# Patient Record
Sex: Male | Born: 1965 | Race: White | Hispanic: Yes | Marital: Married | Smoking: Never smoker
Health system: Western US, Academic
[De-identification: ages and names within clinical notes are randomized; demographics above are authoritative.]

## PROBLEM LIST (undated history)

## (undated) MED ORDER — SODIUM CHLORIDE 0.9 % IV SOLN
INTRAVENOUS | Status: AC
Start: 2014-03-22 — End: ?

---

## 2014-03-02 ENCOUNTER — Encounter (HOSPITAL_COMMUNITY): Payer: Self-pay | Admitting: Nurse Practitioner

## 2014-03-02 DIAGNOSIS — I2724 Chronic thromboembolic pulmonary hypertension: Secondary | ICD-10-CM

## 2014-03-13 ENCOUNTER — Other Ambulatory Visit: Payer: Self-pay | Admitting: Nurse Practitioner

## 2014-03-20 ENCOUNTER — Other Ambulatory Visit: Payer: Managed Care, Other (non HMO) | Attending: Nurse Practitioner

## 2014-03-20 ENCOUNTER — Ambulatory Visit
Admission: RE | Admit: 2014-03-20 | Discharge: 2014-03-20 | Disposition: A | Discharge status: Home / Self Care | Payer: Managed Care, Other (non HMO) | Source: Ambulatory Visit | Attending: Neuroradiology | Admitting: Neuroradiology

## 2014-03-20 ENCOUNTER — Ambulatory Visit
Admission: RE | Admit: 2014-03-20 | Discharge: 2014-03-20 | Disposition: A | Discharge status: Home / Self Care | Payer: Managed Care, Other (non HMO) | Source: Ambulatory Visit | Attending: Nurse Practitioner | Admitting: Nurse Practitioner

## 2014-03-20 DIAGNOSIS — I270 Primary pulmonary hypertension: Secondary | ICD-10-CM | POA: Insufficient documentation

## 2014-03-20 DIAGNOSIS — I2699 Other pulmonary embolism without acute cor pulmonale: Secondary | ICD-10-CM | POA: Insufficient documentation

## 2014-03-20 DIAGNOSIS — Z01818 Encounter for other preprocedural examination: Secondary | ICD-10-CM | POA: Insufficient documentation

## 2014-03-20 DIAGNOSIS — I272 Other secondary pulmonary hypertension: Secondary | ICD-10-CM

## 2014-03-20 DIAGNOSIS — I2724 Chronic thromboembolic pulmonary hypertension: Secondary | ICD-10-CM

## 2014-03-20 LAB — COMPREHENSIVE METABOLIC PANEL, BLOOD
ALT (SGPT): 27 U/L (ref 0–41)
AST (SGOT): 27 U/L (ref 0–40)
Albumin: 4.2 g/dL (ref 3.5–5.2)
Alkaline Phos: 43 U/L (ref 40–129)
Anion Gap: 14 mmol/L (ref 7–15)
BUN: 54 mg/dL — ABNORMAL HIGH (ref 6–20)
Bicarbonate: 23 mmol/L (ref 22–29)
Bilirubin, Tot: 0.18 mg/dL (ref <1.20)
Calcium: 9.5 mg/dL (ref 8.5–10.6)
Chloride: 104 mmol/L (ref 98–107)
Creatinine: 2.47 mg/dL — ABNORMAL HIGH (ref 0.67–1.17)
GFR: 28 mL/min
Glucose: 180 mg/dL — ABNORMAL HIGH (ref 70–115)
Potassium: 4.2 mmol/L (ref 3.5–5.1)
Sodium: 141 mmol/L (ref 136–145)
Total Protein: 7.4 g/dL (ref 6.0–8.0)

## 2014-03-20 LAB — URINALYSIS
Bilirubin: NEGATIVE
Blood: NEGATIVE
Glucose: NEGATIVE
Ketones: NEGATIVE
Leuk Esterase: NEGATIVE
Nitrite: NEGATIVE
Protein: NEGATIVE
Specific Gravity: 1.012 (ref 1.002–1.030)
Urobilinogen: NEGATIVE
pH: 6 (ref 5.0–8.0)

## 2014-03-20 LAB — CBC WITH DIFF, BLOOD
ANC-Automated: 5.6 10*3/uL (ref 1.6–7.0)
Abs Basophils: 0.1 10*3/uL (ref <0.1)
Abs Eosinophils: 0.1 10*3/uL (ref 0.1–0.7)
Abs Lymphs: 2.3 10*3/uL (ref 0.8–3.1)
Abs Monos: 0.7 10*3/uL (ref 0.2–0.8)
Basophils: 1 % (ref 0–2)
Eosinophils: 1 % (ref 1–4)
Hct: 30.7 % — ABNORMAL LOW (ref 40.0–50.0)
Hgb: 9.8 gm/dL — ABNORMAL LOW (ref 13.7–17.5)
Imm Gran %: 2 % — ABNORMAL HIGH (ref <1)
Imm Gran Abs: 0.2 10*3/uL — ABNORMAL HIGH (ref <0.1)
Lymphocytes: 26 % (ref 19–53)
MCH: 27.7 pg (ref 26.0–32.0)
MCHC: 31.9 % — ABNORMAL LOW (ref 32.0–36.0)
MCV: 86.7 um3 (ref 79.0–95.0)
MPV: 10.5 fL (ref 9.4–12.4)
Monocytes: 7 % (ref 5–12)
Plt Count: 416 10*3/uL — ABNORMAL HIGH (ref 140–370)
RBC: 3.54 10*6/uL — ABNORMAL LOW (ref 4.60–6.10)
RDW: 15.5 % — ABNORMAL HIGH (ref 12.0–14.0)
Segs: 62 % (ref 34–71)
WBC: 8.9 10*3/uL (ref 4.0–10.0)

## 2014-03-20 LAB — APTT, BLOOD: PTT: 36.1 s — ABNORMAL HIGH (ref 25.0–34.0)

## 2014-03-20 LAB — TYPE & SCREEN
ABO/RH: AB POS
Antibody Screen: NEGATIVE

## 2014-03-20 LAB — PROTHROMBIN TIME, BLOOD
INR: 1.5
PT,Patient: 16.8 s — ABNORMAL HIGH (ref 9.7–12.5)

## 2014-03-20 LAB — PRO BNP, BLOOD: BNPP: 729 pg/mL (ref 0–899)

## 2014-03-21 ENCOUNTER — Encounter (HOSPITAL_COMMUNITY): Payer: Self-pay | Admitting: Critical Care Medicine

## 2014-03-21 ENCOUNTER — Ambulatory Visit (HOSPITAL_BASED_OUTPATIENT_CLINIC_OR_DEPARTMENT_OTHER): Payer: Managed Care, Other (non HMO) | Admitting: Critical Care Medicine

## 2014-03-21 ENCOUNTER — Ambulatory Visit
Admission: RE | Admit: 2014-03-21 | Discharge: 2014-03-21 | Disposition: A | Discharge status: Home / Self Care | Payer: Managed Care, Other (non HMO) | Source: Ambulatory Visit | Attending: Nurse Practitioner | Admitting: Nurse Practitioner

## 2014-03-21 ENCOUNTER — Encounter (HOSPITAL_COMMUNITY): Payer: Self-pay | Admitting: Nurse Practitioner

## 2014-03-21 ENCOUNTER — Ambulatory Visit (HOSPITAL_BASED_OUTPATIENT_CLINIC_OR_DEPARTMENT_OTHER)
Admission: RE | Admit: 2014-03-21 | Discharge: 2014-03-21 | Disposition: A | Discharge status: Home Health | Payer: Managed Care, Other (non HMO)

## 2014-03-21 VITALS — BP 134/62 | HR 54 | Temp 98.2°F | Resp 16 | Ht 71.0 in | Wt 272.0 lb

## 2014-03-21 DIAGNOSIS — N289 Disorder of kidney and ureter, unspecified: Secondary | ICD-10-CM

## 2014-03-21 DIAGNOSIS — I2699 Other pulmonary embolism without acute cor pulmonale: Secondary | ICD-10-CM

## 2014-03-21 DIAGNOSIS — I359 Nonrheumatic aortic valve disorder, unspecified: Secondary | ICD-10-CM

## 2014-03-21 DIAGNOSIS — I272 Other secondary pulmonary hypertension: Secondary | ICD-10-CM | POA: Insufficient documentation

## 2014-03-21 DIAGNOSIS — I517 Cardiomegaly: Secondary | ICD-10-CM

## 2014-03-21 DIAGNOSIS — I2724 Chronic thromboembolic pulmonary hypertension: Secondary | ICD-10-CM

## 2014-03-21 DIAGNOSIS — I059 Rheumatic mitral valve disease, unspecified: Secondary | ICD-10-CM

## 2014-03-21 DIAGNOSIS — I270 Primary pulmonary hypertension: Secondary | ICD-10-CM

## 2014-03-21 LAB — 2D ECHO WITH IMAGE ENHANCEMENT AGENT IF NECESSARY
AO Mean Gradient: 15 mm Hg
AO Peak Velocity: 2.8 M/sec — ABNORMAL HIGH (ref <2.0)
Aortic Valve Area: 2.9 cm2
Cardac Output: 9.4 L/min — ABNORMAL HIGH (ref 4.0–8.0)
Cardiac Index: 3.9 L/min/m2 (ref 2.8–4.2)
LA Volume Index: 48 mL/m2 (ref 16–28)
LV Ejection Fraction: 63 % (ref >50)
PA Pressure: 46 mm Hg + CVP — ABNORMAL HIGH (ref 20–30)

## 2014-03-21 LAB — ANTI-THROMBIN III, BLOOD: Antithrombin III: 88 % (ref 80–140)

## 2014-03-21 LAB — DIL RUSSELL'S VIPER VENOM, BLOOD
DRWT, Control: 31.1 s
DRWT, Patient: 42.4 s
DRWT, Ratio (Pt/Con): 1.36 (ref <1.20)

## 2014-03-21 LAB — DRVVT - CONFIRM
DRWT Normalized Test Ratio: 0.9 (ref <1.12)
DRWT, Conf, Control: 27.8 s
DRWT, Conf, Patient: 41.9 s

## 2014-03-21 LAB — DRVVT 1:1 MIX, BLOOD
DRWT 1:1 Mix, Patient: 34.3 s
DRWT Ratio 1:1 Mix: 1.1

## 2014-03-21 LAB — PROTEIN C ACTIVITY ASSAY, BLOOD: Protein C Activity: 81 % (ref 70–160)

## 2014-03-21 LAB — HIV 1/2 ANTIBODY & P24 ANTIGEN ASSAY, BLOOD: HIV 1/2 Antibody & P24 Antigen Assay: NONREACTIVE

## 2014-03-21 LAB — CLINICAL INTERP (CONT)

## 2014-03-21 LAB — PROTEIN S ANTIGEN - FREE, BLOOD: Protein S Antigen - Free: 78 % (ref 65–145)

## 2014-03-21 MED ORDER — OMEGA-3-ACID ETHYL ESTERS 1 GM OR CAPS: 2.00 g | ORAL_CAPSULE | Freq: Two times a day (BID) | ORAL | Status: AC

## 2014-03-21 MED ORDER — PRAZOSIN HCL 1 MG OR CAPS: 1.00 mg | ORAL_CAPSULE | Freq: Two times a day (BID) | ORAL | Status: AC

## 2014-03-21 MED ORDER — LEVOTHYROXINE SODIUM 75 MCG OR TABS: 75.00 ug | ORAL_TABLET | Freq: Every day | ORAL | Status: AC

## 2014-03-21 MED ORDER — RIOCIGUAT 1.5 MG PO TABS
1.50 mg | ORAL_TABLET | Freq: Three times a day (TID) | ORAL | Status: DC
Start: ? — End: 2014-04-12

## 2014-03-21 MED ORDER — ROSUVASTATIN CALCIUM 20 MG OR TABS: 20.00 mg | ORAL_TABLET | Freq: Every day | ORAL | Status: AC

## 2014-03-21 MED ORDER — MACITENTAN 10 MG PO TABS
10.00 mg | ORAL_TABLET | Freq: Every day | ORAL | Status: DC
Start: ? — End: 2014-04-12

## 2014-03-21 MED ORDER — CHOLINE FENOFIBRATE 135 MG PO CPDR: 135.00 mg | DELAYED_RELEASE_CAPSULE | Freq: Every day | ORAL | Status: AC

## 2014-03-21 MED ORDER — CARVEDILOL 25 MG OR TABS: 25.00 mg | ORAL_TABLET | Freq: Two times a day (BID) | ORAL | Status: AC

## 2014-03-21 MED ORDER — FUROSEMIDE 40 MG OR TABS
40.00 mg | ORAL_TABLET | Freq: Every day | ORAL | Status: DC
Start: ? — End: 2014-04-12

## 2014-03-21 MED ORDER — ENOXAPARIN SODIUM 120 MG/0.8ML SC SOLN
120.00 mg | Freq: Every day | SUBCUTANEOUS | Status: DC
Start: ? — End: 2014-04-12

## 2014-03-21 NOTE — Progress Notes (Signed)
PATIENT NAME: Edwin Sandoval    STAFF PHYSICIAN: Marquita Palms, M.D.     DATE OF SERVICE: 03/21/2014    CC: Edwin Sandoval is a 48 year old male referred to [place] by Dr. Max Fickle of [place], [place] regarding consultation for the patient's pulmonary vascular disease and candidacy for PTE.     HISTORY OF PRESENT ILLNESS: According to history provided by the patient, he was in good health until January 2015 at which time he developed 'flu' followed by progressive exertional dyspnea.  Evaluation in March, 2015 was consistent with PH.  Cath at that time: RA = 10, PA = 81/23 (38), CO = 6.2, PCWP = 13.  Macitentan and Revatio initiated in April.  Revatio discontinued and Adempas initiated in August with improvement in his functional status but not back to baseline status.  Currrently Class II functional status, experiencing significant dyspnea when climbing > 1 flight of stairs.    PAST MEDICAL HISTORY:     1. 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 DM II  3. Hypothyroidism  4. HTN  5. OSA (CPAP 15)  6. Laparoscopic cholecytstectomy, 2009  7. Chronic renal insufficiency    FAMILY HISTORY: Father deceased, pancreatic [place]; mother alive with DM, HTN    SOCIAL HISTORY: married, 4 biological children, without tobacco, EtOH, or illicit drug use.  Currently on disability, formerly employed as Web designer at [employer]: Vicodin (nausea, vomiting)    CURRENT MEDICATIONS:    1. Coreg, 25 mg BID  2. Trilipix, 135 mg daily  3. Lovenox, 120 mg SQ daily  4. Lasix, 40 mg daily  5. Synthroid, 75 mcg daily  6. Macitentan, 10 mg daily  7. Prazosin, 1 mg BID  8. Adempas, 1.5 mg TID  9. Crestor, 20 mg daily  10. Humulin R, 33 u morning, 27 units lunch, 17 units evening      REVIEW OF SYSTEMS: 12 point ROS unremarkable other than that noted in HPI    PHYSICAL EXAMINATION:     VITAL SIGNS: BP 134/62 mmHg   Pulse 54   Temp(Src) 98.2 F (36.8 C) (Oral)   Resp 16   Ht _0  (1.803 m)   Wt 123.378 kg (272 lb)    BMI 37.95 kg/m2   SpO2 97%  GENERAL: This is a well-developed, well-nourished, alert and orientedmale in no apparent distress, sitting comfortably on the exam table.   HEENT: Head normocephalic, atraumatic, bilateral pupils equal, round and reactive to light, bilateral sclerae non-icteric. Oral mucosa clear, moist, intact without lesions, exudates or erythema.   NECK: Absent carotid bruits. JVD 3 cm above clavicle with patient upright.   CARDIOVASCULAR: S1S2 with P2 accentuation, regular rate and rhythm, Grade II/VI TR murmur, no rubs, lifts, or thrills.   PULMONARY: Lung sounds clear to auscultation bilaterally. Without pulmonary flow murmur. No rales, rhonchi, or wheezes.   ABDOMEN: Bowel sounds active x 4 quadrants, soft, non-tender, non-distended without hepatosplenomegaly.   EXTREMITIES: Without lower extremity edema. Absent clubbing or cyanosis.   SKIN: Grossly intact without lesions or rash.     LABORATORY/IMAGING DATA:     Lab Results   Component Value Date    WBC 8.9 03/20/2014    RBC 3.54 03/20/2014    HGB 9.8 03/20/2014    HCT 30.7 03/20/2014    MCV 86.7 03/20/2014    MCHC 31.9 03/20/2014    RDW 15.5 03/20/2014    PLT 416 03/20/2014  MPV 10.5 03/20/2014     Lab Results   Component Value Date    INR 1.5 03/20/2014    PTT 36.1 03/20/2014      Lab Results   Component Value Date    NA 141 03/20/2014    K 4.2 03/20/2014    CL 104 03/20/2014    BICARB 23 03/20/2014    BUN 54 03/20/2014    CREAT 2.47 03/20/2014    GLU 180 03/20/2014    [place] 9.5 03/20/2014     Lab Results   Component Value Date    ALK 43 03/20/2014    AST 27 03/20/2014    ALT 27 03/20/2014    TP 7.4 03/20/2014    ALB 4.2 03/20/2014    TBILI 0.18 03/20/2014     Chest radiograph: borderline CM, enlarged central PAs    Ventilation/Perfusion scan: multiple bilateral mismatched defects involving majority of RLL with exception of posterobasasl segment; lingular defect on the left    Echocardiogram: normal LV size/function; moderate LA enlargement;  thickened aortic valve with mild aortic stenosis (peak transvalvular gradient 25 mmHg, mean 15 mmHg); normal RA/RV size, TRE 3.4 M/sec, suggesting peak PAS pressure of ~ 45 mmHg    ECG: sinus bradycardia, NSST abnormalities    IMPRESSION/PLAN: The patient is 48 year old male who has evidence suggestive of chronic thromboembolic pulmonary hypertension.  The patient will undergo right heart catheterization with pulmonary angiography in the morning. Further plan of care will be dictated by the findings of those procedures.    Edwin Sandoval underwent right and left heart catheterization with pulmonary and coronary angiography on 03/22/2014:    Hemodynamics:     Right atrial mean pressure: 15 mmHg  Right ventricular pressure: 53/20 mmHg  Pulmonary artery pressure: 53/19, mean 30 mmHg  Pulmonary capillary wedge pressure, mean: 20 mmHg  Thermal dilution cardiac output: 6.7 L per minute  Thermodilution cardiac index: 2.7 L per minute/m2  FICK cardiac output: 9.5 (Left); 7.9 (Right) [L per minute]  FICK cardiac index: 3.9 (Left); 3.2 (Right) [L per minute/m2]  Pulmonary vascular resistance: 1.27 woods units  Pulmonary artery saturation: 70% (Left); 66% (Right)  Right PCWP sat: 93%  Left PCWP sat: 86%    Pulmonary Angiogram: On the right, irregularity of the lateral margin of the interlobar artery; pouch defect RLL with lower lobe flow confined to posterior and superior segment arteries.  On the left, unusual 'pouch' configuration to upper lobe artery with decreased perfusion to posterior LUL and cephalad aspect of the superior segment artery.    Coronary Angiogram: outside study reviewed - without coronary disease    Based on these data, Edwin Sandoval is suffering from surgically-accessible chronic thromboembolic pulmonary hypertension. The natural history of the disease as well as medical and surgical treatment options were discussed with the patient.  Potential risks and benefits of PTE surgery were reviewed  with the patient.   A perioperative mortality risk of 3% was estimated based on the patient's age, co-morbidities, hemodynamic profile, and location/extent of chronic thromboembolic disease.  Cognizant of our findings, recommendations and risks, Edwin Sandoval agrees to proceed with surgery.     A major focus of the discussion was the discordance between the degree of PH/extent of vascular obstruction and the patient's functional status.  It is possible that a portion of the patient's functional limitation is related to the elevated left sided filling pressures and ventricular compliance issues (enlarged LA, elevated PCWP, elevated V waves at the  time of catheterization).      Edwin Sandoval was discharged from PTU in satisfactory condition on 03/22/2014.  IVC filter placement is scheduled on 03/23/2014.  Edwin Sandoval will be admitted to the [facility] on 03/28/2014 with a pulmonary thromboendarterectomy scheduled the following day.          Doretha Sou. Lynden Oxford, M.D.

## 2014-03-22 ENCOUNTER — Ambulatory Visit
Admission: RE | Admit: 2014-03-22 | Discharge: 2014-03-22 | Disposition: A | Discharge status: Home / Self Care | Payer: Managed Care, Other (non HMO) | Source: Ambulatory Visit | Attending: Internal Medicine | Admitting: Internal Medicine

## 2014-03-22 ENCOUNTER — Ambulatory Visit (HOSPITAL_BASED_OUTPATIENT_CLINIC_OR_DEPARTMENT_OTHER): Payer: Managed Care, Other (non HMO) | Admitting: Internal Medicine

## 2014-03-22 ENCOUNTER — Encounter (HOSPITAL_BASED_OUTPATIENT_CLINIC_OR_DEPARTMENT_OTHER): Admission: RE | Disposition: A | Discharge status: Home Health | Payer: Self-pay | Attending: Internal Medicine

## 2014-03-22 DIAGNOSIS — I272 Other secondary pulmonary hypertension: Secondary | ICD-10-CM

## 2014-03-22 DIAGNOSIS — E039 Hypothyroidism, unspecified: Secondary | ICD-10-CM | POA: Insufficient documentation

## 2014-03-22 DIAGNOSIS — I2724 Chronic thromboembolic pulmonary hypertension: Secondary | ICD-10-CM

## 2014-03-22 DIAGNOSIS — G4733 Obstructive sleep apnea (adult) (pediatric): Secondary | ICD-10-CM | POA: Insufficient documentation

## 2014-03-22 DIAGNOSIS — I429 Cardiomyopathy, unspecified: Secondary | ICD-10-CM

## 2014-03-22 DIAGNOSIS — E119 Type 2 diabetes mellitus without complications: Secondary | ICD-10-CM | POA: Insufficient documentation

## 2014-03-22 DIAGNOSIS — Z905 Acquired absence of kidney: Secondary | ICD-10-CM | POA: Insufficient documentation

## 2014-03-22 DIAGNOSIS — I129 Hypertensive chronic kidney disease with stage 1 through stage 4 chronic kidney disease, or unspecified chronic kidney disease: Secondary | ICD-10-CM | POA: Insufficient documentation

## 2014-03-22 DIAGNOSIS — Z0000 Encounter for general adult medical examination without abnormal findings: Secondary | ICD-10-CM

## 2014-03-22 DIAGNOSIS — I2699 Other pulmonary embolism without acute cor pulmonale: Secondary | ICD-10-CM | POA: Insufficient documentation

## 2014-03-22 LAB — GLUCOSE (POCT)
Glucose (POCT): 70 mg/dL (ref 70–115)
Glucose (POCT): 80 mg/dL (ref 70–115)
Glucose (POCT): 75 mg/dL (ref 70–115)

## 2014-03-22 LAB — 2D ECHO WITH IMAGE ENHANCEMENT AGENT IF NECESSARY

## 2014-03-22 LAB — PATHOLOGIST REVIEW

## 2014-03-22 LAB — CARDIOLIPIN ANTIBODY, IGG, BLOOD: Cardiolipin IgG, Ab: <10 [GPL'U] (ref 0–14)

## 2014-03-22 SURGERY — CATHETERIZATION, HEART, RIGHT, WITH BILATERAL PULMONARY ANGIOGRAM

## 2014-03-22 MED ORDER — DEXTROSE-NACL 5-0.9 % IV SOLN (CUSTOM)
Freq: Once | INTRAVENOUS | Status: DC
Start: 2014-03-22 — End: 2014-03-22

## 2014-03-22 MED ORDER — HEPARIN (PORCINE) IN NACL 2-0.9 UNIT/ML-% IJ SOLN
INTRAMUSCULAR | Status: AC
Start: 2014-03-22 — End: 2014-03-22
  Filled 2014-03-22: qty 1000

## 2014-03-22 MED ORDER — FENTANYL CITRATE 0.05 MG/ML IJ SOLN
INTRAMUSCULAR | Status: AC
Start: 2014-03-22 — End: 2014-03-22
  Filled 2014-03-22: qty 2

## 2014-03-22 MED ORDER — STERILE WATER FOR INJECTION IV SOLN (CUSTOM)
INTRAVENOUS | Status: DC
Start: 2014-03-22 — End: 2014-03-22
  Filled 2014-03-22: qty 150

## 2014-03-22 MED ORDER — FUROSEMIDE 10 MG/ML IJ SOLN
INTRAMUSCULAR | Status: AC
Start: 2014-03-22 — End: 2014-03-22
  Filled 2014-03-22: qty 2

## 2014-03-22 MED ORDER — SODIUM CHLORIDE 0.9 % IV SOLN
INTRAVENOUS | Status: DC
Start: 2014-03-22 — End: 2014-03-22

## 2014-03-22 MED ORDER — INSULIN REGULAR HUMAN 500 UNIT/ML (CONC) SC SOLN
Freq: Three times a day (TID) | SUBCUTANEOUS | Status: DC
Start: ? — End: 2014-04-12

## 2014-03-22 MED ORDER — LIDOCAINE HCL 2 % IJ SOLN
INTRAMUSCULAR | Status: AC
Start: 2014-03-22 — End: 2014-03-22
  Filled 2014-03-22: qty 50

## 2014-03-22 MED ORDER — HEPARIN SODIUM (PORCINE) 1000 UNIT/ML IJ SOLN (CUSTOM)
INTRAMUSCULAR | Status: AC
Start: 2014-03-22 — End: 2014-03-22
  Filled 2014-03-22: qty 30

## 2014-03-22 MED ORDER — MIDAZOLAM HCL 2 MG/2ML IJ SOLN
INTRAMUSCULAR | Status: AC
Start: 2014-03-22 — End: 2014-03-22
  Filled 2014-03-22: qty 2

## 2014-03-22 MED ORDER — IODIXANOL 320 MG/ML IV SOLN
INTRAVENOUS | Status: AC
Start: 2014-03-22 — End: 2014-03-22
  Filled 2014-03-22: qty 200

## 2014-03-22 SURGICAL SUPPLY — 17 items
APPLICATOR CHLORAPREP 26ML, ~~LOC~~ (Misc Medical Supply) ×2
CATHETER BALLOON OCCLUSION BERMAN 6FR X 90CM (Drains/Catheter/Tubes/Reservoir) ×2 IMPLANT
CATHETER BALLOON OCCLUSION BERMAN 8FR X 110CM (Drains/Catheter/Tubes/Reservoir) ×2
CATHETER SWAN GANZ 7FR HEP. (Drains/Catheter/Tubes/Reservoir) ×2 IMPLANT
CONTRAST HIGH PRESSURE LINE (Misc Medical Supply) ×2 IMPLANT
COVER PROBE 10X249CM (Drape/Gowns/Gloves/Pack) ×2 IMPLANT
DRAPE PULMONARY CUSTOM (Drape/Gowns/Gloves/Pack) ×2 IMPLANT
DRAPE TRANSRADIAL FEMORAL ANGIOGRAPHY - MEDLINE (Drape/Gowns/Gloves/Pack) ×2 IMPLANT
ELECTRODE RED DOT REPOS CLOTH (Cautery) ×2 IMPLANT
LEADWEAR SINGLE 2.0 SZ 73/5 LRG (Misc Medical Supply) ×2 IMPLANT
PACK HEART MANIFOLD RT 2 PORT (Misc Medical Supply) ×2 IMPLANT
PREP CHLOROPREP 10.5ML 1-STEP (Misc Medical Supply) ×2 IMPLANT
SET PRECISION PINNACLE ACCESS 7FR ECHOGENIC NEEDLE SS WIRE 10CM SHEATH (Procedural wires/sheaths/catheters/balloons/dilators) ×2
SET PRECISION PINNACLE ACCESS 8FR ECHOGENIC NEEDLE SS WIRE 10CM SHEATH (Misc Medical Supply) ×2
SYRINGE CONTRAST INJECTOR 150ML (Needles/punch/cannula/biopsy) ×2 IMPLANT
TRAY CARDIAC CATH CUSTOM (Kits/Sets/Trays) ×2 IMPLANT
WIRE .025 260CM 3MM J EXCHANG (Misc Medical Supply) ×2 IMPLANT

## 2014-03-22 NOTE — Discharge Instructions (Signed)
[facility] for diagnostic procedure to evaluate for pulmonary thromboembolic disease and coronary artery disease.     What Happened During Your Visit:   The main evaluation done for you during this hospitalization was right heart catheterization and pulmonary angiogram, .     Instructions for After Discharge   You should limit your activity at home for the short term, as listed below:   -- Avoid significant exertion for the next 1 day.   -- No heavy lifting (more than 10 pounds) for the next 2 days.   -- You can start or restart an exercise routine after 7 days.     Wound care instructions:   -- Do not submerge your catheterization site wound for the next 7 days. This includes tub baths, pools, hot tubs, or going swimming at the beach.   -- It is OK to shower starting the day after the procedure. You may use soap and water to clean the wound area.   Your medication list is located on this After Visit Summary in the Current Discharge Medication List section. Your nurse will review this information with you before you leave the hospital.   It is very important for you to keep a current medication list with you in order to assist your doctors with your medical care. Bring this After Visit Summary with you to your follow up appointments.     Reasons to Contact a Doctor Urgently   Among reasons to Call 911 or return to the hospital immediately include:   Active bleeding at the site of the catheterization.   New, unexpected chest pain or pressure.     You should contact your physician for any of the following reasons:   -- Bleeding, swelling, bruising, or increased pain at the puncture site.   -- Fever or increasing redness or drainage at the puncture site.   -- Increasing fatigue or decreasing ability to do usual daily activities.     Follow up with Dr. Despina AriasFedullo tomorrow    If you have any questions about your hospital care, your medications, or if you have new or concerning  symptoms soon after going home from the hospital, please contact the physician that you have been working with.      Once you are able to see your physician, your physician will then be responsible for further medication refills, or appointment referrals.

## 2014-03-22 NOTE — H&P (Signed)
[facility] H&P Consult Note    Chief Complaint: No chief complaint on file.      HPI:   48 year old male with history of chronic thromboembolic pulmonary hypertension, Wilms tumor s/p nephrectomy, CKD, (last Cre 2.5) DM, HTN, Hypothyroidism, OSA referred to regarding consultation for the patient's pulmonary vascular disease and candidacy for PTE.  Patient with worsening exertional dyspnea since 06/2013.  Now gets dyspnea when climbing >1 flight of stairs.  Plan for right heart cath.    In the PTU, sugar went down to 47.  Patient felt well.  He was given juice and it improved to 70.  He does not take insulin.  Last lovenox dose yesterday AM.      ROS:  Gen: No recent changes in weight. No fever/chills.  HEENT: No changes in vision, rhinnitis, or changes in hearing.  Resp: +SOB, no cough.  CV: No CP, or palpitations. No LE edema.  Abdomen: No constipation, diarrhea or changes in apetite. Denies BRBPR or melena.  Musc: No joint pain  Neuro: No focal numbness or tingling in upper or lower extremities. No changes in strength    Family History:  Father deceased, pancreatic [place]; mother alive with DM, HTN    Social History:  Denies tobacco, EtOH, or illicit drug use.    Past Medical History:  There is no problem list on file for this patient.      Medications:  1. Coreg, 25 mg BID  2. Trilipix, 135 mg daily  3. Lovenox, 120 mg SQ daily  4. Lasix, 40 mg daily  5. Synthroid, 75 mcg daily  6. Macitentan, 10 mg daily  7. Prazosin, 1 mg BID  8. Adempas, 1.5 mg TID  9. Crestor, 20 mg daily      Allergies:  Allergies   Allergen Reactions    Vicodin [Hydrocodone-Acetaminophen] Nausea and Vomiting           Objective:  BP 145/56 mmHg   Pulse 50   Temp(Src) 97.6 F (36.4 C)   Resp 18   Ht 5\' 11"  (1.803 m)   Wt 126.508 kg (278 lb 14.4 oz)   BMI 38.92 kg/m2   SpO2 98%  General Appearance: healthy, alert, no distress, cooperative.  HEENT: PERRL, EOM's intact. MMM.  Neck:  Supple. No lymphadenopathy.  Heart:  RRR. Normal S1  and S2. No murmurs, rubs or gallops. JVP ~12 cm, PMI non-displaced  Lungs: CTAB, no w/r/r  Abdomen: BS normal.  Abdomen soft, non-tender.  No masses or organomegaly.  Extremities:  Warm and well perfused, 2+ radial & DP pulses bilaterally. Slight Pitting edema in LE, cyanosis, or clubbing.    Recent labs, imaging and chart reviewed.  Lab Results   Component Value Date    WBC 8.9 03/20/2014    RBC 3.54 03/20/2014    HGB 9.8 03/20/2014    HCT 30.7 03/20/2014    MCV 86.7 03/20/2014    MCHC 31.9 03/20/2014    RDW 15.5 03/20/2014    PLT 416 03/20/2014    MPV 10.5 03/20/2014     Lab Results   Component Value Date    NA 141 03/20/2014    K 4.2 03/20/2014    CL 104 03/20/2014    BICARB 23 03/20/2014    BUN 54 03/20/2014    CREAT 2.47 03/20/2014    GLU 180 03/20/2014    [place] 9.5 03/20/2014     No results found for: A1C  No results found for: CHOL,  HDL, LDLCALC, TRIG, LDLDIRECT    CXR 03/20/14  Enlarged central pulmonary arteries. No pneumothorax or pleural effusion.   Suggestive evidence of enlarged right ventricle. Prominent right atrium.      TTE 03/21/14  EF 63%  1) Moderate pulmonary hypertension. RVSP 46 + CVP  2) Normal right ventricular size and function.  3) LV is mildly dilated with normal overall systolic function.  4) Mild concentric LV hypertrophy.  5) Mild aortic stenosis.  6) Mitral valve thickened with mild regurgitation.  7) Mild tricuspid regurgitation.  8) Moderately enlarged left atrium.        ASSESSMENT & PLAN:  Edwin SlocumbJohn Michael Sandoval is a 48 y/o M chronic thromboembolic pulmonary hypertension, Wilms tumor s/p nephrectomy, CKD, (last Cre 2.5) DM, HTN, Hypothyroidism, OSA referred to regarding consultation for the patient's pulmonary vascular disease and candidacy for PTE.    Will proceed with right heart cath and bilateral pulmonary angiography.    -will give 150 cc D5 NS (as blood sugar was down to 47 and improved to 70 with juice)  -will then start sodium bicarbonate drip during procedure    Patient  understands the risks and benefits of the procedure, including up to death.  He wants to proceed with the right heart cath and pulmonary angiography.      ICD-10-CM ICD-9-CM    1. CTEPH (chronic thromboembolic pulmonary hypertension) I27.2 416.8 sodium chloride 0.9% infusion    I26.99 415.19        Patient was staffed with Dr. Sudie BaileyKnowlton who agrees with this assessment and plan.      Marcella DubsArmand Marin Wisner, MD  Cardiology Fellow  Pager [phone]

## 2014-03-22 NOTE — Op Note (Signed)
DATE OF PROCEDURE:  03/22/2014  PREOPERATIVE DIAGNOSIS: Pulmonary hypertension with possible pulmonary thromboembolic disease.  POSTOPERATIVE DIAGNOSIS: Pulmonary hypertension with pulmonary thromboembolic disease.     PROCEDURE: Right heart catheterization, right biplane pulmonary angiogram, left biplane pulmonary angiogram, interpretation of pulmonary angiograms.     SURGEON/STAFF: Sudie BaileyKnowlton    REFERRING PHYSICIAN: Dr. Despina AriasFedullo    ANESTHESIA: Conscious sedation    INDICATIONS: Edwin SlocumbJohn Michael Sandoval is a 48 year old male from [place] who has progressive dyspnea on exertion. The patient is referred for evaluation of possible pulmonary thromboendarterectomy.    DESCRIPTION OF PROCEDURE: The patient was prepped and draped in the usual sterile fashion. The skin over the right internal jugular vein was anesthetized using 2% Xylocaine. A 8 French sheath was inserted into the vein using a modified Seldinger technique. Right heart catheterization was then performed using a 7 French Swan-Ganz catheter. The catheter was then exchanged for a 8 [place] Berman catheter positioned in the right pulmonary artery. Right biplane pulmonary angiography was then performed by injecting 58 mL of contrast at a rate of 22 mL per second. The catheter was then repositioned into the left pulmonary artery where left biplane pulmonary angiogram was performed by injecting 30 mL of contrast at a rate of 15 mL per second. The catheter was then removed and the films were interpreted.     The sheath was removed from the internal jugular vein and once hemostasis was obtained, the patient was discharged to the postanesthesia care unit for observation.    Total contrast: 110 cc Visapaque    RESULTS:    Right heart catheterization:  Right atrial mean pressure: 15 mmHg  Right ventricular pressure: 53/20 mmHg  Pulmonary artery pressure: 53/19, mean 30 mmHg  Pulmonary capillary wedge pressure, mean: 20 mmHg with a large V-wave reaching 35 mmHg  Thermal dilution  cardiac output: 6.7 L per minute  Thermodilution cardiac index: 2.7 L per minute/m2  FICK cardiac output: 9.5 (Left); 7.9 (Right) [L per minute]  FICK cardiac index: 3.9 (Left); 3.2 (Right) [L per minute/m2]  Pulmonary vascular resistance: 381 dynes sec cm-5  Pulmonary artery saturation: 70% (Left); 66% (Right)  Right PCWP sat: 93%  Left PCWP sat: 86%    Pulmonary angiograms:   Left pulmonary artery: There is a suggestion of a pouch in the proximal upper lobe vessel. Flow is decreased to the posterior and apical segments, but it may be related to limited contrast in that area. There is a cut off in a branch of the inferolingula artery.   Right pulmonary artery: There is a prominent pouch in the proximal anteromedial basal artery. Flow to the other vessels is relatively preserved though there are occasional irregularities.     Review of outside coronary arteriograms failed to demonstrate the presence of significant obstructive coronary artery disease.     CONCLUSIONS:  1. Mild pulmonary hypertension  2. Mild proximal pulmonary thromboembolic disease  3. Elevated left atrial pressure based on PCWP at 20 mmHg and prominent V-waves.       DISPOSITION: Based on these results it appears that the patient is a suitable candidate for pulmonary thromboendarterectomy. However, final disposition will be determined after review of all the data available and discussion with the patient.     It is notable that the patient may have abnormal ventricular compliance as evidenced by elevated V-waves in the absence of severe MR.

## 2014-03-23 ENCOUNTER — Encounter (HOSPITAL_BASED_OUTPATIENT_CLINIC_OR_DEPARTMENT_OTHER)
Admission: RE | Disposition: A | Discharge status: Home Health | Payer: Self-pay | Source: Ambulatory Visit | Attending: Diagnostic Radiology

## 2014-03-23 ENCOUNTER — Ambulatory Visit (HOSPITAL_BASED_OUTPATIENT_CLINIC_OR_DEPARTMENT_OTHER): Payer: Managed Care, Other (non HMO) | Admitting: Diagnostic Radiology

## 2014-03-23 ENCOUNTER — Encounter (HOSPITAL_BASED_OUTPATIENT_CLINIC_OR_DEPARTMENT_OTHER): Payer: Self-pay

## 2014-03-23 ENCOUNTER — Ambulatory Visit
Admission: RE | Admit: 2014-03-23 | Discharge: 2014-03-23 | Disposition: A | Discharge status: Home / Self Care | Payer: Managed Care, Other (non HMO) | Source: Ambulatory Visit | Attending: Diagnostic Radiology | Admitting: Diagnostic Radiology

## 2014-03-23 DIAGNOSIS — I2782 Chronic pulmonary embolism: Secondary | ICD-10-CM

## 2014-03-23 LAB — ST2, SOLUBLE, BLOOD: ST2, Soluble: 23.3 ng/mL (ref 10.4–52.1)

## 2014-03-23 SURGERY — IR PLCMT IVC FILTER

## 2014-03-23 MED ORDER — IODIXANOL 320 MG/ML IV SOLN
INTRAVENOUS | Status: DC | PRN
Start: 2014-03-23 — End: 2014-03-23
  Administered 2014-03-23: 25 mL

## 2014-03-23 MED ORDER — MIDAZOLAM HCL 2 MG/2ML IJ SOLN
INTRAMUSCULAR | Status: DC | PRN
Start: 2014-03-23 — End: 2014-03-23
  Administered 2014-03-23 (×2): 1 mg via INTRAVENOUS

## 2014-03-23 MED ORDER — ONDANSETRON HCL 4 MG/2ML IV SOLN
4.0000 mg | INTRAMUSCULAR | Status: DC | PRN
Start: 2014-03-23 — End: 2014-03-23

## 2014-03-23 MED ORDER — FENTANYL CITRATE 0.05 MG/ML IJ SOLN
INTRAMUSCULAR | Status: DC | PRN
Start: 2014-03-23 — End: 2014-03-23
  Administered 2014-03-23 (×3): 50 ug via INTRAVENOUS

## 2014-03-23 MED ORDER — FENTANYL CITRATE 0.05 MG/ML IJ SOLN
25.0000 ug | INTRAMUSCULAR | Status: DC | PRN
Start: 2014-03-23 — End: 2014-03-23

## 2014-03-23 MED ORDER — SODIUM CHLORIDE 0.9 % IV SOLN
INTRAVENOUS | Status: DC | PRN
Start: 2014-03-23 — End: 2014-03-23
  Administered 2014-03-23: 75 mL/h via INTRAVENOUS

## 2014-03-23 SURGICAL SUPPLY — 12 items
APPLICATOR CHLORAPREP 26ML, ~~LOC~~ (Misc Medical Supply) IMPLANT
CATHETER ACCU-VU OMNI FLUSH 5FR (Procedural wires/sheaths/catheters/balloons/dilators) ×2 IMPLANT
CATHETER TORCON NB ADV C2 5FR X 65CM 0.038", BEACON TIP (Drains/Catheter/Tubes/Reservoir) ×2 IMPLANT
DILATOR AQ HYDROPHILIC 16FR X 20CM (Procedural wires/sheaths/catheters/balloons/dilators) ×2 IMPLANT
DRESSING QUIKCLOT INTERVENTIONAL 1.5" X 1.5" (Dressings/packing) ×2 IMPLANT
FILTER IVC GREENFIELD SS, JUGULAR ×2 IMPLANT
FLOWSWITCH TIP HP (Misc Medical Supply) ×2 IMPLANT
KIT CUSTOM ANGIO SYRINGE THORNTON (Kits/Sets/Trays) ×2 IMPLANT
PREP CHLOROPREP 10.5ML 1-STEP (Misc Medical Supply) IMPLANT
PROCEDURE PACK - IR VASCULAR (Drape/Gowns/Gloves/Pack) ×2 IMPLANT
TOWEL BLUE STERILE DISPOSABLE (Drape/Gowns/Gloves/Pack) ×2 IMPLANT
TUBING CONTRAST INJECTOR, HIGH PRESSURE 48" (Drains/Catheter/Tubes/Reservoir) ×2

## 2014-03-23 NOTE — H&P (Signed)
HISTORY & PHYSICAL - INTERVAL ASSESSMENT    **ONLY TO BE USED IN ADDITION TO A HISTORY & PHYSICAL**    Edwin SlocumbJohn Michael Sandoval  [id number]      This interval assessment is required for History & Physical completed less than 30 days but more than 24 hours prior to the admission or surgery. A History & Physical completed more than 30 days prior to the admission or surgery must be repeated.    Current Medical Status:  Unchanged    Medications / Allergies:  Unchanged    Review of Systems:  Unchanged    Physical Examination:  I have examined the patient today. Pertinent portions of the exam are unchanged    Laboratory or Clinical Data:  Unchanged    Modifications of Initial Care Plan:  Unchanged    PRE-PROCEDURE RECORD FOR SEDATION    Planned procedure:  IVC Filter placement    Planned type of sedation:   Moderate sedation.  I have discussed this procedure, its potential risks and complications, the risks associated with refusal of the procedure, and alternatives to the procedure with this patient.  Planned sedation route:  IV.    The patient has consented to the procedure.    Sedation/Anesthesia History:    The patient has no history of an adverse reaction to sedation or anesthesia.    I have assessed the patient's status immediately prior to this procedure:  Yes  I have discussed pain management needs and options for the patient with the patient or caregiver:  Yes    Sedation options, risks, and plans have been discussed with the patient or caregiver.  Questions were answered.  The patient or caregiver agrees to proceed as planned.    Airway History and Assessment:      Airway Class:  Class III.  Neck range of motion  normal.    Potential airway issues?  History of or risks for obstructive sleep apnea    ASA classification of physical status:  3.  A patient with severe systemic disease that limits activity but is not incapacitating.    --    Marcheta GrammesFranklin Danelia Sandoval     03/23/2014     12:15 PM

## 2014-03-23 NOTE — Discharge Instructions (Signed)
RADIOLOGY  POST-PROCEDURE INSTRUCTIONS  PLACEMENT OF INFERIOR VENA CAVA (IVC) FILTER    Sedation  · The sedative medications used during the procedure will remain in your system for up to 24 hours.  Therefore, we would like you to take it easy for the rest of the day.  Do not drive, sign any legal documents, or drink alcohol.    Activity  · No exercising or lifting heavy objects (anything over 10 pounds) for 72 hours after the procedure.  · You may remove the dressing and shower 24 hours after the procedure.    Pain Management  · You may use over-the-counter acetaminophen (Tylenol) for minor discomfort, if you are not otherwise restricted from using this medication.    Care of the Puncture Site  · DO NOT apply any creams or lotions to the puncture site.  · Apply direct pressure if any swelling or bleeding occurs at the puncture site.  · If you had a closure device (e.g., Starclose or Angioseal) used to close your vein, please refer to the brochure that you were given.  · There may be a small lump and/or bruise in the groin.  This is normal.  · Drink six to eight glasses of liquids following the procedure.    When to Call Your Physician  · Fever (temperature over 100 degrees) or chills  · Severe pain or chest pain  · Bleeding, redness, swelling, or drainage around the puncture site  · Pain, coldness, or swelling of the leg or arm beyond the puncture site  · Marked decrease in the amount you are urinating  · Shortness of breath  · Nausea, vomiting, or severe dizziness  · Fluid continues to leak out of the puncture site after pressure is applied    Medications  · Please resume taking your regular medications, unless otherwise instructed by your doctor.    If you had a removable filter placed, please contact your doctor in 6 to 7 weeks to see if it can be removed.    Additional Instructions (if  any)    _____________________________________________________________    _____________________________________________________________    Contact Us    · During regular business hours (Monday trough Friday from 8:00 AM to 4:00 PM), please call [phone] ([facility]) or [phone] ([place]).    After hours, please call the [facility] page operator at [phone] and ask for the interventional radiologist on call.

## 2014-03-23 NOTE — Progress Notes (Signed)
PCCM Attending:    Given the patient's renal insufficiency, he was advised to discontinue the 120 mg. Lovenox dosing and was provided with a prescription for 40 mg daily.    Bronson IngPeter Conlan Miceli, M.D.

## 2014-03-26 ENCOUNTER — Telehealth (HOSPITAL_COMMUNITY): Payer: Self-pay | Admitting: Nurse Practitioner

## 2014-03-26 ENCOUNTER — Other Ambulatory Visit: Payer: Managed Care, Other (non HMO) | Attending: Nurse Practitioner

## 2014-03-26 DIAGNOSIS — N289 Disorder of kidney and ureter, unspecified: Secondary | ICD-10-CM

## 2014-03-26 LAB — BASIC METABOLIC PANEL, BLOOD
Anion Gap: 18 mmol/L — ABNORMAL HIGH (ref 7–15)
BUN: 52 mg/dL — ABNORMAL HIGH (ref 6–20)
Bicarbonate: 23 mmol/L (ref 22–29)
Calcium: 9.2 mg/dL (ref 8.5–10.6)
Chloride: 97 mmol/L — ABNORMAL LOW (ref 98–107)
Creatinine: 2.52 mg/dL — ABNORMAL HIGH (ref 0.67–1.17)
GFR: 27 mL/min
Glucose: 122 mg/dL — ABNORMAL HIGH (ref 70–115)
Potassium: 4.3 mmol/L (ref 3.5–5.1)
Sodium: 138 mmol/L (ref 136–145)

## 2014-03-26 NOTE — Telephone Encounter (Signed)
Called pt to get a BMP checked today.  He told me he will get it this afternoon.  BMP ordered in Epic.      Jilda Pandahao Marvell Tamer, NP  Division of Pulmonary and Critical Care Medicine

## 2014-03-28 ENCOUNTER — Inpatient Hospital Stay (HOSPITAL_COMMUNITY): Admission: RE | Admit: 2014-03-28 | Payer: Self-pay | Admitting: Pulmonary Medicine

## 2014-03-28 ENCOUNTER — Inpatient Hospital Stay
Admission: RE | Admit: 2014-03-28 | Discharge: 2014-04-13 | DRG: 271 | Disposition: A | Discharge status: Home / Self Care | Payer: Managed Care, Other (non HMO) | Attending: Thoracic Surgery (Cardiothoracic Vascular Surgery) | Admitting: Thoracic Surgery (Cardiothoracic Vascular Surgery)

## 2014-03-28 DIAGNOSIS — Z713 Dietary counseling and surveillance: Secondary | ICD-10-CM | POA: Diagnosis present

## 2014-03-28 DIAGNOSIS — L7682 Other postprocedural complications of skin and subcutaneous tissue: Secondary | ICD-10-CM

## 2014-03-28 DIAGNOSIS — I129 Hypertensive chronic kidney disease with stage 1 through stage 4 chronic kidney disease, or unspecified chronic kidney disease: Secondary | ICD-10-CM | POA: Diagnosis present

## 2014-03-28 DIAGNOSIS — N183 Chronic kidney disease, stage 3 unspecified (CMS-HCC): Secondary | ICD-10-CM | POA: Diagnosis present

## 2014-03-28 DIAGNOSIS — E039 Hypothyroidism, unspecified: Secondary | ICD-10-CM | POA: Diagnosis present

## 2014-03-28 DIAGNOSIS — G4733 Obstructive sleep apnea (adult) (pediatric): Secondary | ICD-10-CM | POA: Diagnosis present

## 2014-03-28 DIAGNOSIS — Z8249 Family history of ischemic heart disease and other diseases of the circulatory system: Secondary | ICD-10-CM

## 2014-03-28 DIAGNOSIS — J9600 Acute respiratory failure, unspecified whether with hypoxia or hypercapnia: Secondary | ICD-10-CM

## 2014-03-28 DIAGNOSIS — Z833 Family history of diabetes mellitus: Secondary | ICD-10-CM

## 2014-03-28 DIAGNOSIS — E785 Hyperlipidemia, unspecified: Secondary | ICD-10-CM

## 2014-03-28 DIAGNOSIS — Z0000 Encounter for general adult medical examination without abnormal findings: Secondary | ICD-10-CM

## 2014-03-28 DIAGNOSIS — I2782 Chronic pulmonary embolism: Secondary | ICD-10-CM | POA: Diagnosis present

## 2014-03-28 DIAGNOSIS — E1142 Type 2 diabetes mellitus with diabetic polyneuropathy: Secondary | ICD-10-CM

## 2014-03-28 DIAGNOSIS — I272 Other secondary pulmonary hypertension: Principal | ICD-10-CM | POA: Diagnosis present

## 2014-03-28 DIAGNOSIS — E1165 Type 2 diabetes mellitus with hyperglycemia: Secondary | ICD-10-CM

## 2014-03-28 DIAGNOSIS — Z794 Long term (current) use of insulin: Secondary | ICD-10-CM

## 2014-03-28 DIAGNOSIS — I4891 Unspecified atrial fibrillation: Secondary | ICD-10-CM | POA: Diagnosis not present

## 2014-03-28 DIAGNOSIS — I2699 Other pulmonary embolism without acute cor pulmonale: Secondary | ICD-10-CM

## 2014-03-28 DIAGNOSIS — Z800 Family history of malignant neoplasm of digestive organs: Secondary | ICD-10-CM

## 2014-03-28 DIAGNOSIS — IMO0002 Reserved for concepts with insufficient information to code with codable children: Secondary | ICD-10-CM | POA: Diagnosis present

## 2014-03-28 DIAGNOSIS — N189 Chronic kidney disease, unspecified: Secondary | ICD-10-CM | POA: Diagnosis present

## 2014-03-28 DIAGNOSIS — Z905 Acquired absence of kidney: Secondary | ICD-10-CM | POA: Diagnosis present

## 2014-03-28 DIAGNOSIS — E1121 Type 2 diabetes mellitus with diabetic nephropathy: Secondary | ICD-10-CM | POA: Diagnosis present

## 2014-03-28 DIAGNOSIS — I2724 Chronic thromboembolic pulmonary hypertension: Secondary | ICD-10-CM

## 2014-03-28 DIAGNOSIS — E118 Type 2 diabetes mellitus with unspecified complications: Secondary | ICD-10-CM

## 2014-03-28 LAB — TSH, BLOOD: TSH: 2.71 u[IU]/mL (ref 0.27–4.20)

## 2014-03-28 LAB — CBC WITH DIFF, BLOOD
ANC-Automated: 4.3 10*3/uL (ref 1.6–7.0)
Abs Eosinophils: 0.1 10*3/uL (ref 0.1–0.7)
Abs Lymphs: 2.2 10*3/uL (ref 0.8–3.1)
Abs Monos: 0.7 10*3/uL (ref 0.2–0.8)
Basophils: 1 % (ref 0–2)
Eosinophils: 1 % (ref 1–4)
Hct: 30.8 % — ABNORMAL LOW (ref 40.0–50.0)
Hgb: 10.1 gm/dL — ABNORMAL LOW (ref 13.7–17.5)
Lymphocytes: 30 % (ref 19–53)
MCH: 28.3 pg (ref 26.0–32.0)
MCHC: 32.8 % (ref 32.0–36.0)
MCV: 86.3 um3 (ref 79.0–95.0)
MPV: 10.4 fL (ref 9.4–12.4)
Monocytes: 10 % (ref 5–12)
Plt Count: 337 10*3/uL (ref 140–370)
RBC: 3.57 10*6/uL — ABNORMAL LOW (ref 4.60–6.10)
RDW: 15.4 % — ABNORMAL HIGH (ref 12.0–14.0)
Segs: 59 % (ref 34–71)
WBC: 7.3 10*3/uL (ref 4.0–10.0)

## 2014-03-28 LAB — BASIC METABOLIC PANEL, BLOOD
Anion Gap: 15 mmol/L (ref 7–15)
BUN: 54 mg/dL — ABNORMAL HIGH (ref 6–20)
Bicarbonate: 23 mmol/L (ref 22–29)
Calcium: 9.8 mg/dL (ref 8.5–10.6)
Chloride: 103 mmol/L (ref 98–107)
Creatinine: 2.49 mg/dL — ABNORMAL HIGH (ref 0.67–1.17)
GFR: 28 mL/min
Glucose: 63 mg/dL — ABNORMAL LOW (ref 70–115)
Potassium: 3.8 mmol/L (ref 3.5–5.1)
Sodium: 141 mmol/L (ref 136–145)

## 2014-03-28 LAB — URINALYSIS
Bilirubin: NEGATIVE
Glucose: NEGATIVE
Ketones: NEGATIVE
Leuk Esterase: NEGATIVE
Nitrite: NEGATIVE
Protein: NEGATIVE
Specific Gravity: 1.011 (ref 1.002–1.030)
Urobilinogen: NEGATIVE
pH: 6 (ref 5.0–8.0)

## 2014-03-28 LAB — LIVER PANEL, BLOOD
ALT (SGPT): 27 U/L (ref 0–41)
AST (SGOT): 26 U/L (ref 0–40)
Albumin: 4.3 g/dL (ref 3.5–5.2)
Alkaline Phos: 40 U/L (ref 40–129)
Bilirubin, Dir: <0.2 mg/dL (ref <0.2)
Bilirubin, Tot: 0.21 mg/dL (ref <1.20)
Total Protein: 7.4 g/dL (ref 6.0–8.0)

## 2014-03-28 LAB — TYPE & SCREEN
ABO/RH: AB POS
Antibody Screen: NEGATIVE

## 2014-03-28 LAB — PROTHROMBIN TIME, BLOOD
INR: 1
PT,Patient: 11.1 s (ref 9.7–12.5)

## 2014-03-28 LAB — APTT, BLOOD: PTT: 30.4 s (ref 25.0–34.0)

## 2014-03-28 LAB — GLUCOSE (POCT): Glucose (POCT): 218 mg/dL — ABNORMAL HIGH (ref 70–115)

## 2014-03-28 MED ORDER — NALOXONE HCL 0.4 MG/ML IJ SOLN
0.1000 mg | INTRAMUSCULAR | Status: DC | PRN
Start: 2014-03-28 — End: 2014-03-29

## 2014-03-28 MED ORDER — LEVOTHYROXINE SODIUM 75 MCG OR TABS
75.0000 ug | ORAL_TABLET | Freq: Every day | ORAL | Status: DC
Start: 2014-03-29 — End: 2014-03-29
  Administered 2014-03-29: 75 ug via ORAL
  Filled 2014-03-28: qty 1

## 2014-03-28 MED ORDER — GLUCOSE 4 GM PO CHEW (CUSTOM)
4.0000 | CHEWABLE_TABLET | ORAL | Status: DC | PRN
Start: 2014-03-28 — End: 2014-03-29

## 2014-03-28 MED ORDER — DEXTROSE 50 % IV SOLN
12.5000 g | INTRAVENOUS | Status: DC | PRN
Start: 2014-03-28 — End: 2014-03-29

## 2014-03-28 MED ORDER — CARVEDILOL 25 MG OR TABS
25.00 mg | ORAL_TABLET | Freq: Two times a day (BID) | ORAL | Status: AC
Start: 2014-03-28 — End: 2014-03-28
  Administered 2014-03-28: 25 mg via ORAL
  Filled 2014-03-28: qty 1

## 2014-03-28 MED ORDER — ROSUVASTATIN CALCIUM 10 MG OR TABS
20.0000 mg | ORAL_TABLET | Freq: Every day | ORAL | Status: DC
Start: 2014-03-29 — End: 2014-03-29

## 2014-03-28 MED ORDER — RIOCIGUAT 1 MG PO TABS
1.50 mg | ORAL_TABLET | Freq: Three times a day (TID) | ORAL | Status: AC
Start: 2014-03-28 — End: 2014-03-28
  Administered 2014-03-28: 1.5 mg via ORAL
  Filled 2014-03-28: qty 1

## 2014-03-28 MED ORDER — ZOLPIDEM TARTRATE 5 MG OR TABS
5.0000 mg | ORAL_TABLET | Freq: Every evening | ORAL | Status: DC | PRN
Start: 2014-03-28 — End: 2014-03-29
  Administered 2014-03-28: 5 mg via ORAL
  Filled 2014-03-28: qty 1

## 2014-03-28 MED ORDER — INSULIN LISPRO (HUMAN) 100 UNIT/ML SC SOLN (CUSTOM)
1.0000 [IU] | Freq: Four times a day (QID) | INTRAMUSCULAR | Status: DC
Start: 2014-03-28 — End: 2014-03-29
  Administered 2014-03-28: 23:00:00 1 [IU] via SUBCUTANEOUS
  Administered 2014-03-29: 06:00:00 2 [IU] via SUBCUTANEOUS
  Filled 2014-03-28: qty 2
  Filled 2014-03-28: qty 1

## 2014-03-28 MED ORDER — GLUCAGON HCL (RDNA) 1 MG IJ SOLR
1.0000 mg | Freq: Once | INTRAMUSCULAR | Status: DC | PRN
Start: 2014-03-28 — End: 2014-03-29

## 2014-03-28 MED ORDER — DEXTROSE (DIABETIC USE) 40 % OR GEL
1.0000 | ORAL | Status: DC | PRN
Start: 2014-03-28 — End: 2014-03-29

## 2014-03-28 NOTE — Anesthesia Preprocedure Evaluation (Addendum)
ANESTHESIA PRE-OPERATIVE EVALUATION    Patient Information    Name: Edwin Sandoval    MRN: [id number]    DOB: 10/19/1965    Age: 48 year old    Sex: male  Procedure(s):  PTE PULMONARY THROMBOENDARTERECTOMY      There were no vitals taken for this visit.        Primary language spoken:  English    ROS/Medical History:  General Review & History of Anesthetic Complications:   Patient summary reviewed   No history of anesthetic complications, No PONV, No history of difficult intubation and no family history of anesthetic complications      Cardiovascular:    (+)  hypercholesterolemia  hypertension  CHF  DOE            NYHA Classification: II  ROS comment: Echo 03/21/14:  Conclusions:  EF 63%  1) Moderate pulmonary hypertension.  2) Normal right ventricular size and function.  3) LV is mildly dilated with normal overall systolic function.  4) Mild concentric LV hypertrophy.  5) Mild aortic stenosis.  6) Mitral valve thickened with mild regurgitation.  7) Mild tricuspid regurgitation.  8) Moderately enlarged left atrium.  9) No previous study for comparison.     Right Heart Cath:  Right atrial mean pressure: 15 mmHg  Right ventricular pressure: 53/20 mmHg  Pulmonary artery pressure: 53/19, mean 30 mmHg  Pulmonary capillary wedge pressure, mean: 20 mmHg with a large V-wave reaching 35 mmHg  Thermal dilution cardiac output: 6.7 L per minute  Thermodilution cardiac index: 2.7 L per minute/m2  FICK cardiac output: 9.5 (Left); 7.9 (Right) (L per minute)  FICK cardiac index: 3.9 (Left); 3.2 (Right) (L per minute/m2)  Pulmonary vascular resistance: 381 dynes sec cm-5  Pulmonary artery saturation: 70% (Left); 66% (Right)  Right PCWP sat: 93%  Left PCWP sat: 86%    Pulmonary angiograms:   Left pulmonary artery: There is a suggestion of a pouch in the proximal upper lobe vessel. Flow is decreased to the posterior and apical segments, but it may be related to limited contrast in that area. There is a cut off in a branch of the  inferolingula artery.   Right pulmonary artery: There is a prominent pouch in the proximal anteromedial basal artery. Flow to the other vessels is relatively preserved though there are occasional irregularities.     Review of outside coronary arteriograms failed to demonstrate the presence of significant obstructive coronary artery disease.     CONCLUSIONS:  1. Mild pulmonary hypertension  2. Mild proximal pulmonary thromboembolic disease  3. Elevated left atrial pressure based on PCWP at 20 mmHg and prominent V-waves.    Pulmonary:  - negative ROS   (+) sleep apnea on CPAP, , , , ,  Hematology/Oncology:   (+) anemia,   Neuro/Psych:   - negative ROS        Infectious Disease:   Negative ROS         Endo/Other:      (+) diabetes mellitus ( Last cr 2.52, K 4.3), hypothyroidism,  GI/Hepatic:       ROS Comments: Obesity     Renal:       Positive for chronic renal disease ( Wilms tumor s/p nephrectomy) and CRI     Pregnancy History:             [facility]) Notes:     by [facility] provider  [facility] Echo/ECG/Angio test available.   :  Physical Exam    Airway:  Inter-inciser distance > 4 cm  Prognanth Able    Mallampati: I  Neck ROM: full  TM distance: > 6 cm  Short thick neck: Yes    Cardiovascular:    Rhythm: regular   Rate: normal         Pulmonary:  - pulmonary exam normal           Neuro/Neck/Skeletal/Skin:      Dental:  - normal exam    Abdominal:      General: morbid obesity     Additional Clinical Notes:               Last  OSA (STOP BANG) Score:  No Data Recorded    Last OSA Score for   No Data Recorded                 No past medical history on file.  No past surgical history on file.  History   Substance Use Topics    Smoking status: Never Smoker     Smokeless tobacco: Not on file    Alcohol Use: No       No current facility-administered medications for this encounter.     Current Outpatient Prescriptions   Medication Sig Dispense Refill    carvedilol (COREG) 25 MG tablet Take 25 mg by mouth 2  times daily (with meals).      Choline Fenofibrate (TRILIPIX) 135 MG CPDR Take 135 mg by mouth daily.      enoxaparin (LOVENOX) 120 MG/0.8ML injection Inject 120 mg under the skin daily.      furosemide (LASIX) 40 MG tablet Take 40 mg by mouth daily.      insulin regular human, CONC, (HUMULIN R) 500 UNIT/ML SOLN Inject under the skin 3 times daily. 33 units every morning, 27 units at lunch, 17 units every evening       levothyroxine (SYNTHROID) 75 MCG tablet Take 75 mcg by mouth every morning (before breakfast).      macitentan (OPSUMIT) 10 MG tablet Take 10 mg by mouth daily.      omega-3 acid ethyl esters (LOVAZA) 1 GM capsule Take 2 g by mouth 2 times daily.      prazosin (MINIPRESS) 1 MG capsule Take 1 mg by mouth 2 times daily.      riociguat (ADEMPAS) 1.5 MG TABS Take 1.5 mg by mouth 3 times daily.      rosuvastatin (CRESTOR) 20 MG tablet Take 20 mg by mouth daily.       Allergies   Allergen Reactions    Vicodin [Hydrocodone-Acetaminophen] Nausea and Vomiting       Labs and Other Data  Lab Results   Component Value Date    NA 138 03/26/2014    K 4.3 03/26/2014    CL 97 03/26/2014    BICARB 23 03/26/2014    BUN 52 03/26/2014    CREAT 2.52 03/26/2014    GLU 122 03/26/2014    [place] 9.2 03/26/2014     Lab Results   Component Value Date    AST 27 03/20/2014    ALT 27 03/20/2014    ALK 43 03/20/2014    TP 7.4 03/20/2014    ALB 4.2 03/20/2014    TBILI 0.18 03/20/2014     Lab Results   Component Value Date    WBC 8.9 03/20/2014    RBC 3.54 03/20/2014    HGB 9.8 03/20/2014    HCT 30.7 03/20/2014  MCV 86.7 03/20/2014    MCHC 31.9 03/20/2014    RDW 15.5 03/20/2014    PLT 416 03/20/2014    MPV 10.5 03/20/2014    SEG 62 03/20/2014    LYMPHS 26 03/20/2014    MONOS 7 03/20/2014    EOS 1 03/20/2014    BASOS 1 03/20/2014     Lab Results   Component Value Date    INR 1.5 03/20/2014    PTT 36.1 03/20/2014     No results found for: ARTPH, ARTPO2, ARTPCO2    Anesthesia Plan:  ASA 4 (Incapacitating disease)     Planned  induction method: general   Planned monitoring method: Routine monitoring, Arterial line monitoring, Central venous monitoring and Pulmonary arterial monitoring      Comments:    Dental risks explained & discussed  Anesthetic plan and risks discussed with Patient.     Use of blood products discussed with patient whom.           Plan discussed with Attending.

## 2014-03-28 NOTE — H&P (Addendum)
H&P note     CC: Edwin Sandoval is a 48 year old male referred to [place] by Dr. Max Fickle of [place], [place] regarding consultation for the patient's pulmonary vascular disease and candidacy for PTE.     HISTORY OF PRESENT ILLNESS: According to history provided by the patient, he was in good health until January 2015 at which time he developed 'flu' followed by progressive exertional dyspnea. Evaluation in March, 2015 was consistent with PH. Cath at that time: RA = 10, PA = 81/23 (38), CO = 6.2, PCWP = 13. Macitentan and Revatio initiated in April. Revatio discontinued and Adempas initiated in August with improvement in his functional status but not back to baseline status. Currrently Class II functional status, experiencing significant dyspnea when climbing > 1 flight of stairs.    PAST MEDICAL HISTORY:     1. 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 DM II  3. Hypothyroidism  4. HTN  5. OSA (CPAP 15)  6. Laparoscopic cholecytstectomy, 2009  7. Chronic renal insufficiency    FAMILY HISTORY: Father deceased, pancreatic [place]; mother alive with DM, HTN    SOCIAL HISTORY: married, 4 biological children, without tobacco, EtOH, or illicit drug use. Currently on disability, formerly employed as Web designer at [employer]:    Vicodin        (nausea, vomiting)    CURRENT MEDICATIONS:    1. Coreg, 25 mg BID  2. Trilipix, 135 mg daily  3. Lovenox, 120 mg SQ daily  4. Lasix, 40 mg daily  5. Synthroid, 75 mcg daily  6. Macitentan, 10 mg daily  7. Prazosin, 1 mg BID  8. Adempas, 1.5 mg TID  9. Crestor, 20 mg daily  10. Humulin R, 33 u morning, 27 units lunch, 17 units evening      REVIEW OF SYSTEMS: 12 point ROS unremarkable other than that noted in HPI    PHYSICAL EXAMINATION:     VITAL SIGNS:    BP 134/62 mmHg   Pulse 54   Temp(Src) 98.2 F (36.8 C) (Oral)   Resp 16   Ht 5' 11"  (1.803 m)   Wt 123.378 kg (272 lb)   BMI 37.95 kg/m2   SpO2 97%         GENERAL: This is a well-developed, well-nourished,  alert and orientedmale in no apparent distress, sitting comfortably on the exam table.   HEENT: Head normocephalic, atraumatic, bilateral pupils equal, round and reactive to light, bilateral sclerae non-icteric. Oral mucosa clear, moist, intact without lesions, exudates or erythema.   NECK: Absent carotid bruits. JVD 3 cm above clavicle with patient upright.   CARDIOVASCULAR: S1S2 with P2 accentuation, regular rate and rhythm, Grade II/VI TR murmur, no rubs, lifts, or thrills.   PULMONARY: Lung sounds clear to auscultation bilaterally. Without pulmonary flow murmur. No rales, rhonchi, or wheezes.   ABDOMEN: Bowel sounds active x 4 quadrants, soft, non-tender, non-distended without hepatosplenomegaly.   EXTREMITIES: Without lower extremity edema. Absent clubbing or cyanosis.   SKIN: Grossly intact without lesions or rash.     LABORATORY/IMAGING DATA:   Lab Results    Component  Value  Date     WBC  8.9  03/20/2014     RBC  3.54  03/20/2014     HGB  9.8  03/20/2014     HCT  30.7  03/20/2014     MCV  86.7  03/20/2014     MCHC  31.9  03/20/2014  RDW  15.5  03/20/2014     PLT  416  03/20/2014     MPV  10.5  03/20/2014      Lab Results    Component  Value  Date     INR  1.5  03/20/2014     PTT  36.1  03/20/2014       Lab Results    Component  Value  Date     NA  141  03/20/2014     K  4.2  03/20/2014     CL  104  03/20/2014     BICARB  23  03/20/2014     BUN  54  03/20/2014     CREAT  2.47  03/20/2014     GLU  180  03/20/2014     [place]  9.5  03/20/2014      Lab Results    Component  Value  Date     ALK  43  03/20/2014     AST  27  03/20/2014     ALT  27  03/20/2014     TP  7.4  03/20/2014     ALB  4.2  03/20/2014     TBILI  0.18  03/20/2014      Chest radiograph: borderline CM, enlarged central PAs    Ventilation/Perfusion scan: multiple bilateral mismatched defects involving majority of RLL with exception of posterobasasl segment; lingular defect on the left    Echocardiogram: normal LV size/function; moderate LA  enlargement; thickened aortic valve with mild aortic stenosis (peak transvalvular gradient 25 mmHg, mean 15 mmHg); normal RA/RV size, TRE 3.4 M/sec, suggesting peak PAS pressure of ~ 45 mmHg    ECG: sinus bradycardia, NSST abnormalities    IMPRESSION/PLAN: The patient is 48 year old male who has evidence suggestive of chronic thromboembolic pulmonary hypertension. The patient will undergo right heart catheterization with pulmonary angiography in the morning. Further plan of care will be dictated by the findings of those procedures.    Edwin Sandoval underwent right and left heart catheterization with pulmonary and coronary angiography on 03/22/2014:    Hemodynamics:     Right atrial mean pressure: 15 mmHg  Right ventricular pressure: 53/20 mmHg  Pulmonary artery pressure: 53/19, mean 30 mmHg  Pulmonary capillary wedge pressure, mean: 20 mmHg  Thermal dilution cardiac output: 6.7 L per minute  Thermodilution cardiac index: 2.7 L per minute/m2  FICK cardiac output: 9.5 (Left); 7.9 (Right) [L per minute]  FICK cardiac index: 3.9 (Left); 3.2 (Right) [L per minute/m2]  Pulmonary vascular resistance: 1.27 woods units  Pulmonary artery saturation: 70% (Left); 66% (Right)  Right PCWP sat: 93%  Left PCWP sat: 86%    Pulmonary Angiogram: On the right, irregularity of the lateral margin of the interlobar artery; pouch defect RLL with lower lobe flow confined to posterior and superior segment arteries. On the left, unusual 'pouch' configuration to upper lobe artery with decreased perfusion to posterior LUL and cephalad aspect of the superior segment artery.    Coronary Angiogram: outside study reviewed - without coronary disease    IVC filter placed on 10/23    A/P: 48 year old male patient with history DM, HTN, CKD who is coming for PTE after diagnosis of PTE. 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 A perioperative mortality risk of 3%  was estimated based on the patient's age, co-morbidities, hemodynamic profile, and location/extent of chronic thromboembolic disease.  Will send pre op lab, keep patient NPO for PTE tomorrow.   Received 40 lovenox today am, no need for lovenox at night ( CKD)    Full code    D/W Dr.Eisley Barber.     Edwin Eng, MD PCCM fellow       ATTENDING HISTORY AND PHYSICAL ATTESTATION    Subjective    Chief complaint:  DOE, CTEPH    History of present illness:  48 y/o M with 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    See fellow history and physical for further details of the patient's history.    Objective  NAD, RRR, CTAB,  NTND, obese, trace edema  I have examined the patient and concur with the fellow exam.    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  1. CTEPH:  - Labs today  - Hold AM Lovenox  - NPO p MN  - PTE in AM  - Continue home meds today  - Continue Adempas today; hold post-op  2. OSA:  - Continue home CPAP  3. DM:  - Continue insulin regimen and hold once pt is NPO  - Will need insulin gtt post-op  4. Hypothyroidism:  - Continue Synthroid  - Check TSH  5. CKD:  - Follow BUN/Cr and UO  - Close monitoring post-op  6. Hypercholeterolemia:  - Statin and Trilipix once extubated post-op    I agree with the fellow care plan.  See the fellow and physical for further details.    Francesca Jewett, MD  Pulmonary and Critical Care  PID [id number] / Pager [phone]

## 2014-03-29 ENCOUNTER — Inpatient Hospital Stay (HOSPITAL_COMMUNITY): Payer: Managed Care, Other (non HMO) | Admitting: Student in an Organized Health Care Education/Training Program

## 2014-03-29 ENCOUNTER — Encounter (HOSPITAL_COMMUNITY)
Admission: RE | Disposition: A | Discharge status: Home Health | Payer: Self-pay | Attending: Thoracic Surgery (Cardiothoracic Vascular Surgery)

## 2014-03-29 DIAGNOSIS — G4733 Obstructive sleep apnea (adult) (pediatric): Secondary | ICD-10-CM

## 2014-03-29 DIAGNOSIS — I452 Bifascicular block: Secondary | ICD-10-CM

## 2014-03-29 DIAGNOSIS — I2782 Chronic pulmonary embolism: Secondary | ICD-10-CM

## 2014-03-29 DIAGNOSIS — I272 Other secondary pulmonary hypertension: Secondary | ICD-10-CM

## 2014-03-29 DIAGNOSIS — I444 Left anterior fascicular block: Secondary | ICD-10-CM

## 2014-03-29 LAB — BASIC METABOLIC PANEL, BLOOD
Anion Gap: 18 mmol/L — ABNORMAL HIGH (ref 7–15)
BUN: 55 mg/dL — ABNORMAL HIGH (ref 6–20)
Bicarbonate: 20 mmol/L — ABNORMAL LOW (ref 22–29)
Calcium: 8.8 mg/dL (ref 8.5–10.6)
Chloride: 101 mmol/L (ref 98–107)
Creatinine: 2.48 mg/dL — ABNORMAL HIGH (ref 0.67–1.17)
GFR: 28 mL/min
Glucose: 234 mg/dL — ABNORMAL HIGH (ref 70–115)
Potassium: 4 mmol/L (ref 3.5–5.1)
Sodium: 139 mmol/L (ref 136–145)

## 2014-03-29 LAB — ARTERIAL BLOOD GAS
BE, Art: -1.3 mmol/L (ref <=1.2)
BE, Art: -2.2 mmol/L (ref <=1.2)
BE, Art: -1.5 mmol/L (ref <=1.2)
BE, Art: -0.5 mmol/L (ref <=1.2)
FIO2: 100 %
FIO2: 100 %
FIO2: 75 %
FIO2: 60 %
HCO3, Art: 22 mmol/L — ABNORMAL LOW (ref 23–29)
HCO3, Art: 22 mmol/L — ABNORMAL LOW (ref 23–29)
HCO3, Art: 22 mmol/L — ABNORMAL LOW (ref 23–29)
HCO3, Art: 22 mmol/L — ABNORMAL LOW (ref 23–29)
O2 Sat, Art (Est): 96.2 % (ref 94–100)
O2 Sat, Art (Est): 98.6 % (ref 94–100)
Temp: 35.9 'C
Temp: 36.3 'C
Temp: 36.6 'C
Temp: 37 'C
pCO2, Art (T): 32 mmHg — ABNORMAL LOW (ref 36–46)
pCO2, Art (T): 34 mmHg — ABNORMAL LOW (ref 36–46)
pCO2, Art (T): 32 mmHg — ABNORMAL LOW (ref 36–46)
pCO2, Art (T): 32 mmHg — ABNORMAL LOW (ref 36–46)
pCO2, Art (Uncorr): 33 mmHg (ref 36–46)
pCO2, Art (Uncorr): 35 mmHg (ref 36–46)
pCO2, Art (Uncorr): 33 mmHg (ref 36–46)
pCO2, Art (Uncorr): 32 mmHg (ref 36–46)
pH, Art (T): 7.43 (ref 7.35–7.46)
pH, Art (T): 7.45 (ref 7.35–7.46)
pH, Art (T): 7.45 (ref 7.35–7.46)
pH, Art (T): 7.46 (ref 7.35–7.46)
pH, Art (Uncorr): 7.41 (ref 7.35–7.46)
pH, Art (Uncorr): 7.44 (ref 7.35–7.46)
pH, Art (Uncorr): 7.45 (ref 7.35–7.46)
pH, Art (Uncorr): 7.46 (ref 7.35–7.46)
pO2, Art (T): 70 mmHg — ABNORMAL LOW (ref 74–109)
pO2, Art (T): 76 mmHg (ref 74–109)
pO2, Art (T): 74 mmHg (ref 74–109)
pO2, Art (T): 120 mmHg — ABNORMAL HIGH (ref 74–109)
pO2, Art (Uncorr): 76 mmHg (ref 74–109)
pO2, Art (Uncorr): 79 mmHg (ref 74–109)
pO2, Art (Uncorr): 76 mmHg (ref 74–109)
pO2, Art (Uncorr): 120 mmHg (ref 74–109)

## 2014-03-29 LAB — CBC WITH DIFF, BLOOD
ANC-Automated: 12.1 10*3/uL — ABNORMAL HIGH (ref 1.6–7.0)
ANC-Manual Mode: 13.4 10*3/uL — ABNORMAL HIGH (ref 1.6–7.0)
Abs Lymphs: 0.9 10*3/uL (ref 0.8–3.1)
Abs Lymphs: 0.7 10*3/uL — ABNORMAL LOW (ref 0.8–3.1)
Abs Monos: <0.1 10*3/uL — ABNORMAL LOW (ref 0.2–0.8)
Abs Monos: 0.5 10*3/uL (ref 0.2–0.8)
Bands: 10 % (ref 0–15)
Hct: 26.6 % — ABNORMAL LOW (ref 40.0–50.0)
Hct: 28.3 % — ABNORMAL LOW (ref 40.0–50.0)
Hgb: 8.9 gm/dL — ABNORMAL LOW (ref 13.7–17.5)
Hgb: 9.6 gm/dL — ABNORMAL LOW (ref 13.7–17.5)
Lymphocytes: 6 % — ABNORMAL LOW (ref 19–53)
Lymphocytes: 5 % — ABNORMAL LOW (ref 19–53)
MCH: 28.6 pg (ref 26.0–32.0)
MCH: 28.7 pg (ref 26.0–32.0)
MCHC: 33.5 % (ref 32.0–36.0)
MCHC: 33.9 % (ref 32.0–36.0)
MCV: 85.5 um3 (ref 79.0–95.0)
MCV: 84.5 um3 (ref 79.0–95.0)
MPV: 10.1 fL (ref 9.4–12.4)
MPV: 10.3 fL (ref 9.4–12.4)
Monocytes: 4 % — ABNORMAL LOW (ref 5–12)
Myelocytes: 1 %
Number of Cells Counted: 115
Plt Count: 177 10*3/uL (ref 140–370)
Plt Count: 215 10*3/uL (ref 140–370)
Plt Est: NORMAL
RBC: 3.11 10*6/uL — ABNORMAL LOW (ref 4.60–6.10)
RBC: 3.35 10*6/uL — ABNORMAL LOW (ref 4.60–6.10)
RDW: 15.4 % — ABNORMAL HIGH (ref 12.0–14.0)
RDW: 15.5 % — ABNORMAL HIGH (ref 12.0–14.0)
Segs: 83 % — ABNORMAL HIGH (ref 34–71)
Segs: 91 % — ABNORMAL HIGH (ref 34–71)
WBC: 14.4 10*3/uL — ABNORMAL HIGH (ref 4.0–10.0)
WBC: 13.3 10*3/uL — ABNORMAL HIGH (ref 4.0–10.0)

## 2014-03-29 LAB — GLUCOSE (POCT)
Glucose (POCT): 173 mg/dL — ABNORMAL HIGH (ref 70–115)
Glucose (POCT): 181 mg/dL — ABNORMAL HIGH (ref 70–115)
Glucose (POCT): 186 mg/dL — ABNORMAL HIGH (ref 70–115)
Glucose (POCT): 196 mg/dL — ABNORMAL HIGH (ref 70–115)
Glucose (POCT): 197 mg/dL — ABNORMAL HIGH (ref 70–115)
Glucose (POCT): 210 mg/dL — ABNORMAL HIGH (ref 70–115)
Glucose (POCT): 216 mg/dL — ABNORMAL HIGH (ref 70–115)
Glucose (POCT): 222 mg/dL — ABNORMAL HIGH (ref 70–115)
Glucose (POCT): 233 mg/dL — ABNORMAL HIGH (ref 70–115)
Glucose (POCT): 238 mg/dL — ABNORMAL HIGH (ref 70–115)
Glucose (POCT): 263 mg/dL — ABNORMAL HIGH (ref 70–115)

## 2014-03-29 LAB — MAGNESIUM, BLOOD: Magnesium: 2.5 mg/dL (ref 1.7–2.6)

## 2014-03-29 LAB — POTASSIUM, ART WHOLE BLOOD
Potassium, Art: 3.9 mmol/L (ref 3.5–5.0)
Potassium, Art: 3.9 mmol/L (ref 3.5–5.0)
Potassium, Art: 4.3 mmol/L (ref 3.5–5.0)

## 2014-03-29 LAB — APTT, BLOOD: PTT: 28.2 s (ref 25.0–34.0)

## 2014-03-29 LAB — PHOSPHORUS, BLOOD: Phosphorous: 2.4 mg/dL — ABNORMAL LOW (ref 2.7–4.5)

## 2014-03-29 LAB — PROTHROMBIN TIME, BLOOD
INR: 1.4
PT,Patient: 15 s — ABNORMAL HIGH (ref 9.7–12.5)

## 2014-03-29 LAB — PREPARE/CROSSMATCH PRBCS

## 2014-03-29 LAB — CALCIUM, IONIZED, ARTERIAL
Ca Ionized, Art: 1.06 mmol/L — ABNORMAL LOW (ref 1.13–1.32)
Ca Ionized, Art: 1.07 mmol/L — ABNORMAL LOW (ref 1.13–1.32)
Ca Ionized, Art: 1.07 mmol/L — ABNORMAL LOW (ref 1.13–1.32)

## 2014-03-29 LAB — GLUCOSE, ARTERIAL WHOLE BLOOD: Glucose, Art: 216 mg/dL — ABNORMAL HIGH (ref 65–110)

## 2014-03-29 LAB — TEG NATIVE WITHOUT HEPARINASE

## 2014-03-29 LAB — GLYCOSYLATED HGB(A1C), BLOOD: Glyco Hgb (A1C): 8.2 % — ABNORMAL HIGH (ref 4.8–5.9)

## 2014-03-29 LAB — APC RESISTANT FV(LEIDEN): Active Prot C Resist Fva: NORMAL

## 2014-03-29 SURGERY — THROMBOENDARTERECTOMY, PULMONARY
Anesthesia: General | Site: Chest

## 2014-03-29 MED ORDER — SODIUM CHLORIDE 0.9 % IV SOLN
0.0000 ug/min | INTRAVENOUS | Status: DC
Start: 2014-03-29 — End: 2014-03-30
  Administered 2014-03-29: 80 ug/min via INTRAVENOUS
  Administered 2014-03-29 (×2): 40 ug/min via INTRAVENOUS
  Filled 2014-03-29: qty 8

## 2014-03-29 MED ORDER — FUROSEMIDE 10 MG/ML IJ SOLN
20.0000 mg | Freq: Four times a day (QID) | INTRAMUSCULAR | Status: DC
Start: 2014-03-29 — End: 2014-03-30

## 2014-03-29 MED ORDER — FUROSEMIDE 10 MG/ML IJ SOLN
INTRAMUSCULAR | Status: DC | PRN
Start: 2014-03-29 — End: 2014-03-29
  Administered 2014-03-29: 40 mg via INTRAVENOUS

## 2014-03-29 MED ORDER — ALBUMIN HUMAN 5 % IV SOLN
INTRAVENOUS | Status: DC | PRN
Start: 2014-03-29 — End: 2014-03-29
  Administered 2014-03-29: 500 mL via INTRAVENOUS

## 2014-03-29 MED ORDER — FAMOTIDINE IN NACL 20 MG/50ML IV SOLN
20.0000 mg | Freq: Two times a day (BID) | INTRAVENOUS | Status: DC
Start: 2014-03-29 — End: 2014-03-29

## 2014-03-29 MED ORDER — INSULIN REGULAR IV BOLUS FROM BAG
1.0000 [IU] | Freq: Once | INTRAMUSCULAR | Status: DC | PRN
Start: 2014-03-29 — End: 2014-03-31

## 2014-03-29 MED ORDER — HYDROCODONE-ACETAMINOPHEN 5-325 MG OR TABS
1.0000 | ORAL_TABLET | ORAL | Status: DC | PRN
Start: 2014-03-29 — End: 2014-03-31
  Administered 2014-03-30: 1 via ORAL
  Filled 2014-03-29: qty 1

## 2014-03-29 MED ORDER — SODIUM CHLORIDE 0.9 % IV SOLN
INTRAVENOUS | Status: DC | PRN
Start: 2014-03-29 — End: 2014-03-29
  Administered 2014-03-29: 07:00:00 via INTRAVENOUS

## 2014-03-29 MED ORDER — FENTANYL CITRATE 0.05 MG/ML IJ SOLN
INTRAMUSCULAR | Status: DC | PRN
Start: 2014-03-29 — End: 2014-03-29
  Administered 2014-03-29: 250 ug via INTRAVENOUS
  Administered 2014-03-29: 150 ug via INTRAVENOUS
  Administered 2014-03-29 (×3): 250 ug via INTRAVENOUS
  Administered 2014-03-29 (×2): 100 ug via INTRAVENOUS

## 2014-03-29 MED ORDER — FENTANYL CITRATE 0.05 MG/ML IJ SOLN
25.0000 ug | INTRAMUSCULAR | Status: AC | PRN
Start: 2014-03-29 — End: 2014-03-30
  Administered 2014-03-30 (×2): 25 ug via INTRAVENOUS
  Filled 2014-03-29 (×6): qty 2

## 2014-03-29 MED ORDER — SODIUM CHLORIDE 0.9 % IV SOLN
10.0000 mg | INTRAVENOUS | Status: DC | PRN
Start: 2014-03-29 — End: 2014-03-29
  Administered 2014-03-29 (×2): 20 ug/min via INTRAVENOUS

## 2014-03-29 MED ORDER — DOPAMINE IN D5W 1.6-5 MG/ML-% IV SOLN
0.0000 ug/kg/min | INTRAVENOUS | Status: DC
Start: 2014-03-29 — End: 2014-03-30
  Administered 2014-03-29: 3 ug/kg/min via INTRAVENOUS
  Administered 2014-03-29: 5 ug/kg/min via INTRAVENOUS
  Administered 2014-03-29: 4 ug/kg/min via INTRAVENOUS
  Administered 2014-03-29 – 2014-03-30 (×2): 3 ug/kg/min via INTRAVENOUS
  Filled 2014-03-29: qty 250

## 2014-03-29 MED ORDER — SODIUM CHLORIDE 0.9 % IV SOLN
1500.0000 mg | Freq: Two times a day (BID) | INTRAVENOUS | Status: DC
Start: 2014-03-29 — End: 2014-04-08
  Administered 2014-03-29 – 2014-04-08 (×20): 1500 mg via INTRAVENOUS
  Filled 2014-03-29 (×20): qty 1500

## 2014-03-29 MED ORDER — FAMOTIDINE 20 MG OR TABS
20.0000 mg | ORAL_TABLET | Freq: Two times a day (BID) | ORAL | Status: DC
Start: 2014-03-29 — End: 2014-03-29

## 2014-03-29 MED ORDER — LACTATED RINGERS IV SOLN
INTRAVENOUS | Status: DC | PRN
Start: 2014-03-29 — End: 2014-03-29
  Administered 2014-03-29: 12:00:00 via INTRAVENOUS

## 2014-03-29 MED ORDER — HEPARIN SODIUM (PORCINE) 1000 UNIT/ML IJ SOLN (CUSTOM)
INTRAMUSCULAR | Status: DC | PRN
Start: 2014-03-29 — End: 2014-03-29
  Administered 2014-03-29: 51000 [IU] via INTRAVENOUS

## 2014-03-29 MED ORDER — FENTANYL CITRATE 0.05 MG/ML IJ SOLN
25.0000 ug | INTRAMUSCULAR | Status: DC | PRN
Start: 2014-03-29 — End: 2014-03-31
  Administered 2014-03-30 – 2014-03-31 (×3): 25 ug via INTRAVENOUS

## 2014-03-29 MED ORDER — CEFUROXIME SODIUM 1.5 GM IJ SOLR
INTRAMUSCULAR | Status: DC | PRN
Start: 2014-03-29 — End: 2014-03-29
  Administered 2014-03-29: 1500 mg via INTRAVENOUS

## 2014-03-29 MED ORDER — HYDROCODONE-ACETAMINOPHEN 10-325 MG OR TABS
1.0000 | ORAL_TABLET | ORAL | Status: DC | PRN
Start: 2014-03-29 — End: 2014-03-31
  Administered 2014-03-30: 1 via ORAL
  Filled 2014-03-29 (×2): qty 1

## 2014-03-29 MED ORDER — NALOXONE HCL 0.4 MG/ML IJ SOLN
0.1000 mg | INTRAMUSCULAR | Status: DC | PRN
Start: 2014-03-29 — End: 2014-04-13

## 2014-03-29 MED ORDER — FAMOTIDINE IN NACL 20 MG/50ML IV SOLN
20.0000 mg | Freq: Every day | INTRAVENOUS | Status: DC
Start: 2014-03-29 — End: 2014-03-30
  Administered 2014-03-29 – 2014-03-30 (×2): 20 mg via INTRAVENOUS
  Filled 2014-03-29 (×3): qty 50

## 2014-03-29 MED ORDER — FAMOTIDINE 20 MG OR TABS
20.0000 mg | ORAL_TABLET | Freq: Every day | ORAL | Status: DC
Start: 2014-03-29 — End: 2014-03-30

## 2014-03-29 MED ORDER — MAGNESIUM SULFATE IN D5W 10-5 MG/ML-% IV SOLN
1.0000 g | Freq: Once | INTRAVENOUS | Status: DC
Start: 2014-03-29 — End: 2014-03-29

## 2014-03-29 MED ORDER — HYDROCODONE-ACETAMINOPHEN 5-325 MG OR TABS
1.0000 | ORAL_TABLET | ORAL | Status: DC | PRN
Start: 2014-03-29 — End: 2014-03-31
  Filled 2014-03-29: qty 1

## 2014-03-29 MED ORDER — ETOMIDATE 2 MG/ML IV SOLN
INTRAVENOUS | Status: DC | PRN
Start: 2014-03-29 — End: 2014-03-29
  Administered 2014-03-29: 20 mg via INTRAVENOUS

## 2014-03-29 MED ORDER — HEPARIN SODIUM (PORCINE) PF 5000 UNIT/0.5ML IJ SOLN
5000.0000 [IU] | Freq: Three times a day (TID) | INTRAMUSCULAR | Status: DC
Start: 2014-03-29 — End: 2014-04-01
  Administered 2014-03-29 – 2014-04-01 (×8): 5000 [IU] via SUBCUTANEOUS
  Filled 2014-03-29 (×8): qty 0.5

## 2014-03-29 MED ORDER — PROTAMINE SULFATE 10 MG/ML IV SOLN
INTRAVENOUS | Status: DC | PRN
Start: 2014-03-29 — End: 2014-03-29
  Administered 2014-03-29: 25 mg via INTRAVENOUS
  Administered 2014-03-29: 500 mg via INTRAVENOUS
  Administered 2014-03-29: 14:00:00 25 mg via INTRAVENOUS

## 2014-03-29 MED ORDER — INSULIN REGULAR HUMAN 100 UNIT/ML IJ SOLN
INTRAMUSCULAR | Status: DC | PRN
Start: 2014-03-29 — End: 2014-03-29
  Administered 2014-03-29: 8 [IU] via INTRAVENOUS

## 2014-03-29 MED ORDER — DEXTROSE-NACL 5-0.45 % IV SOLN (CUSTOM)
INTRAVENOUS | Status: DC
Start: 2014-03-29 — End: 2014-04-10
  Administered 2014-03-29: 16:00:00 via INTRAVENOUS

## 2014-03-29 MED ORDER — ACETAMINOPHEN 160 MG/5ML OR SOLN
650.0000 mg | ORAL | Status: DC | PRN
Start: 2014-03-29 — End: 2014-04-13
  Administered 2014-04-05: 650 mg via NASOGASTRIC
  Filled 2014-03-29: qty 20.3

## 2014-03-29 MED ORDER — SODIUM CHLORIDE 0.9 % IV SOLN
INTRAVENOUS | Status: DC
Start: 2014-03-29 — End: 2014-03-31
  Administered 2014-03-29: 11 [IU]/h via INTRAVENOUS
  Administered 2014-03-30: 8.6 [IU]/h via INTRAVENOUS
  Administered 2014-03-30: 14.7 [IU]/h via INTRAVENOUS
  Administered 2014-03-30: 9.9 [IU]/h via INTRAVENOUS
  Administered 2014-03-30: 11 [IU]/h via INTRAVENOUS
  Administered 2014-03-30: 8.6 [IU]/h via INTRAVENOUS
  Administered 2014-03-30 (×2): 9.9 [IU]/h via INTRAVENOUS
  Administered 2014-03-30: 12.5 [IU]/h via INTRAVENOUS
  Administered 2014-03-30: 8.6 [IU]/h via INTRAVENOUS
  Administered 2014-03-30: 11 [IU]/h via INTRAVENOUS
  Administered 2014-03-30: 12.5 [IU]/h via INTRAVENOUS
  Administered 2014-03-30 – 2014-03-31 (×3): 11 [IU]/h via INTRAVENOUS
  Filled 2014-03-29 (×5): qty 100

## 2014-03-29 MED ORDER — CALCIUM GLUCONATE 1000 MG/60 ML D5W
1.00 g | Freq: Once | Status: AC
Start: 2014-03-29 — End: 2014-03-29
  Administered 2014-03-29: 1 g via INTRAVENOUS
  Filled 2014-03-29: qty 50

## 2014-03-29 MED ORDER — ROCURONIUM BROMIDE 100 MG/10ML IV SOLN
INTRAVENOUS | Status: DC | PRN
Start: 2014-03-29 — End: 2014-03-29
  Administered 2014-03-29: 100 mg via INTRAVENOUS
  Administered 2014-03-29 (×2): 50 mg via INTRAVENOUS

## 2014-03-29 MED ORDER — SODIUM CHLORIDE 0.9 % IR SOLN
Status: DC | PRN
Start: 2014-03-29 — End: 2014-03-29
  Administered 2014-03-29: 13:00:00

## 2014-03-29 MED ORDER — SODIUM CHLORIDE 0.9 % IV SOLN
INTRAVENOUS | Status: DC
Start: 2014-03-29 — End: 2014-03-29
  Administered 2014-03-29: 2 [IU]/h via INTRAVENOUS
  Filled 2014-03-29: qty 100

## 2014-03-29 MED ORDER — ALBUMIN HUMAN 5 % IV SOLN
12.5000 g | INTRAVENOUS | Status: DC | PRN
Start: 2014-03-29 — End: 2014-03-30

## 2014-03-29 MED ORDER — FENTANYL CITRATE 0.05 MG/ML IJ SOLN
50.0000 ug | INTRAMUSCULAR | Status: AC | PRN
Start: 2014-03-29 — End: 2014-03-30
  Administered 2014-03-29 – 2014-03-30 (×5): 50 ug via INTRAVENOUS
  Filled 2014-03-29 (×4): qty 2

## 2014-03-29 MED ORDER — NITROGLYCERIN IN D5W 200-5 MCG/ML-% IV SOLN
0.0000 ug/min | INTRAVENOUS | Status: DC
Start: 2014-03-29 — End: 2014-03-30

## 2014-03-29 MED ORDER — PROPOFOL 10 MG/ML IV EMUL
0.0000 ug/kg/min | INTRAVENOUS | Status: DC
Start: 2014-03-29 — End: 2014-03-30
  Administered 2014-03-29: 50 ug/kg/min via INTRAVENOUS
  Administered 2014-03-29 (×2): 40 ug/kg/min via INTRAVENOUS
  Administered 2014-03-29 – 2014-03-30 (×6): 50 ug/kg/min via INTRAVENOUS
  Filled 2014-03-29 (×2): qty 100
  Filled 2014-03-29: qty 200
  Filled 2014-03-29 (×2): qty 100

## 2014-03-29 MED ORDER — LACTATED RINGERS IV SOLN
INTRAVENOUS | Status: DC | PRN
Start: 2014-03-29 — End: 2014-03-29

## 2014-03-29 MED ORDER — PROPOFOL IV BOLUS 10 MG/ML
INTRAVENOUS | Status: DC | PRN
Start: 2014-03-29 — End: 2014-03-29
  Administered 2014-03-29 (×2): 50 mg via INTRAVENOUS

## 2014-03-29 MED ORDER — DOPAMINE IN D5W 1.6-5 MG/ML-% IV SOLN
INTRAVENOUS | Status: DC | PRN
Start: 2014-03-29 — End: 2014-03-29
  Administered 2014-03-29: 3 ug/kg/min via INTRAVENOUS

## 2014-03-29 MED ORDER — MIDAZOLAM HCL 2 MG/2ML IJ SOLN
INTRAMUSCULAR | Status: DC | PRN
Start: 2014-03-29 — End: 2014-03-29
  Administered 2014-03-29: 07:00:00 2 mg via INTRAVENOUS
  Administered 2014-03-29: 3 mg via INTRAVENOUS
  Administered 2014-03-29: 2 mg via INTRAVENOUS
  Administered 2014-03-29: 3 mg via INTRAVENOUS
  Administered 2014-03-29: 2 mg via INTRAVENOUS

## 2014-03-29 SURGICAL SUPPLY — 79 items
APPLICATOR CHLORAPREP 26ML, ~~LOC~~ (Misc Medical Supply) IMPLANT
BAG DEVON SCOPE ADJUSTMENT BAG (Drape/Gowns/Gloves/Pack) ×2
BLADE COARSE CORE FAN OFFSET (Knives/Blades) IMPLANT
BLADE STERNUM 32.0MM CUT EDGE (Knives/Blades) ×2
BLANKET BAIR HUGGER UNDERBODY (Misc Medical Supply) ×2
BLANKET HYPOTHERMIA ADULT (Misc Medical Supply) ×2
CABLE EXTENSION PACING 6 FT DISPOSABLE SCREW-DOWN (Misc Medical Supply) ×4 IMPLANT
CAUTERY TIP EDGE BLADE ELECTRODE 4.0", INSULATED (Knives/Blades) IMPLANT
CLIP HEMOSTATIC HORIZON LRG, TITANIUM (6), ~~LOC~~ (Suture) ×16 IMPLANT
CLIP HEMOSTATIC HORIZON MED/LRG, TITANIUM (6), GREEN (Suture) IMPLANT
CLIP HEMOSTATIC HORIZON MEDIUM, TITANIUM (6), BLUE (Suture) ×4 IMPLANT
CLIP HEMOSTATIC HORIZON SMALL, TITANIUM (6), YELLOW (Suture) IMPLANT
CONNECTOR JP HEMADUCT CARDIOTHORACIC MULTI-PORT (Drains/Catheter/Tubes/Reservoir) ×2 IMPLANT
CONTAINER PREFIL FORMALIN 120ML (Misc Medical Supply) ×4 IMPLANT
COVER CAMERA LIGHT HANDLE 57MM BLUE HARMON (Drape/Gowns/Gloves/Pack) ×2 IMPLANT
COVER LIGHT HANDLE (Drape/Gowns/Gloves/Pack) ×4
CRADLE FOAM ARM (Misc Medical Supply) ×2 IMPLANT
DERMABOND ADVANCE 0.7ML (Suture) IMPLANT
DRAIN NOVAPLUS CARDIO-THORACIC 24FR ROUND HUBLESS SILICONE (Drains/Catheter/Tubes/Reservoir) ×6 IMPLANT
DRAIN WATER SEAL CHEST TUBE COLLECTION (Drains/Catheter/Tubes/Reservoir) ×2 IMPLANT
DRAPE ECOLAB SLUSH+WARMER DISC 44" X 66" (ORS-320) (Drape/Gowns/Gloves/Pack) ×2 IMPLANT
DRAPE STERI-DRAPE 1050 23" X 17" (Drape/Gowns/Gloves/Pack) ×2
DRESSING MEPILEX BORDER LITE 2 X 5 IN (Dressings/packing) ×2 IMPLANT
DRESSING MEPILEX BORDER SACRUM 23 X 23CM (Dressings/packing) IMPLANT
DRESSING MEPILEX BORDER SILICONE 4" X 12" (Dressings/packing)
DRESSING MEPILEX BORDER SILICONE 4"" X 10"" (Dressings/packing)
DRESSING MEPILEX BORDER SILICONE 4"" X 12"" (Dressings/packing)
DRESSING TEGADERM HP 4X4 (Dressings/packing) IMPLANT
GLOVE BIOGEL PI ULTRATOUCH SIZE 7.5 (Drape/Gowns/Gloves/Pack) ×40 IMPLANT
GOWN MICRO COOL LG BLUE, AAMI LVL 4 (Drape/Gowns/Gloves/Pack) ×6 IMPLANT
GOWN SIRUS XLG BLUE, AAMI LVL 4 (Drape/Gowns/Gloves/Pack) ×2
HEMOSTAT SURGICEL SNOW 4" X 4" (Misc Medical Supply) ×2 IMPLANT
HOLDER TUBING ORGANIZER 10 HOLE SURGE CARDIOVASCULAR (Misc Medical Supply) ×2
KIT PREVENA 20CM (PREVENA UNIT, DRESSING, 45ML CANNISTER) (Dressings/packing) ×2 IMPLANT
KIT SUTURE CARDIAC CUSTOM (Suture) ×2
MARKER DEVON UTILITY WITH LABELS- STERILE (Misc Medical Supply) ×2
NEEDLE PROTECT 18G X 1.5" (Needles/punch/cannula/biopsy)
PAD DEFIB CPR ADULT STAT-PADZ (Misc Medical Supply) ×2 IMPLANT
PAD GROUND VALLEYLAB REM ADULT E7507 (Misc Medical Supply) ×4
PERF ONLY ADULT PTE DISPOSABLE SUPPLIES (Services) ×2 IMPLANT
PERF ONLY CANNULA AORTIC ROOT 14GA (Cannula) ×2
PERF ONLY CANNULA EZ GLIDE AORTIC 24FR (Procedural wires/sheaths/catheters/balloons/dilators) ×2 IMPLANT
PERF ONLY CANNULA VENOUS 28FR WIRE REINFORCED (Cannula) ×2
PERF ONLY CANNULA VENOUS 34FR LIGHTHOUSE (Cannula) ×2 IMPLANT
PERF ONLY CENTRIFUGAL PUMP REVOLUTION NO (Services) ×2
PERF ONLY COOLING JACKET KIT ADULT CARD* (Misc Medical Supply) ×2
PERF ONLY COOLING JACKET, SIZE 23CM SMALL ADULT (Misc Medical Supply) ×2
PERF ONLY HEMOCONCENTRATOR SH14 W/36" L (Kits/Sets/Trays) ×2
PERF ONLY PTE SERVICES (Services) ×2 IMPLANT
PERF ONLY SHUNT SENSOR CDI SYSTEM500, HEP-TREATED, DISPOSABLE (Misc Medical Supply) ×2 IMPLANT
PERF ONLY SORIN LEVEL SENSOR MOUNTING PAD, WHITE (Misc Medical Supply) ×2
PERF ONLY SUMP LEFT VENT 14FR (Misc Medical Supply) ×2
RUBBERBANDS DENTAL 3/16" STERILE (Misc Medical Supply) IMPLANT
SEALANT COSEAL 4ML (Non-Pharmacy Meds/Solutions) ×2 IMPLANT
SENSOR TEMP GENL PURP RECT 9F (Anesthesia Supply) ×2 IMPLANT
SKIN PREP -BATH CLOTH CHG 2% (Misc Medical Supply) ×2
SLEEVE SCD KNEE MEDIUM (Misc Medical Supply) ×2
SOLUTION IRR POUR BTL 0.9% NS 1000ML (Non-Pharmacy Meds/Solutions) ×10 IMPLANT
SOLUTION IRR POUR BTL H20 1000ML (Non-Pharmacy Meds/Solutions) ×6
SPONGE GAUZE RF-DETECT 4" X 4" 16-PLY (Dressings/packing) IMPLANT
SPONGE LAP RF DETECT 18" X 18" XRAY STERILE (Drape/Gowns/Gloves/Pack) IMPLANT
SUPPORT HEART JANKE/BARRON (Misc Medical Supply) ×2 IMPLANT
SURGERY PACK - ADULT OPEN HEART TRAY (Drape/Gowns/Gloves/Pack) ×2
SUTURE ETHILON 2-0 18" FS (Suture) ×6 IMPLANT
SUTURE ETHILON 3-0 18" PS-2 (Suture) ×4 IMPLANT
SUTURE PROLENE 4-0 30" SH-1 (Suture) IMPLANT
SUTURE PROLENE 4-0 36" RB-1 (Suture) ×1
SUTURE PROLENE 4-0 36" RB-1 8557H (Suture) ×1 IMPLANT
SUTURE PROLENE 5-0 18" RB-2 (Suture) ×4
SUTURE PROLENE 5-0 36" RB-1 (Suture) ×1
SUTURE PROLENE 5-0 36" RB-1 8556H (Suture) ×1 IMPLANT
SUTURE PROLENE 6-0 18" RB-2 (Suture)
SUTURE PROLENE 6-0 30" RB-2 (Suture) ×2
SUTURE RETENTION PTFE POLYMER PLEDGET BUTRESS (Suture) ×2
SUTURE STEEL 7 CCS (Suture) ×4 IMPLANT
SYRINGE HYPO LL 10CC (Needles/punch/cannula/biopsy) IMPLANT
TAPE UMBILICAL COTTON 1/8X30 (Misc Medical Supply) ×2 IMPLANT
TRAY FOLEY TEMPERATURE-SENSING 16FR IC METER (Drains/Catheter/Tubes/Reservoir) ×2 IMPLANT
WIRE TEMPORARY CARDIAC PACING 24" WHITE (Misc Medical Supply) ×4

## 2014-03-29 NOTE — Anesthesia Procedure Notes (Addendum)
Arterial Line Procedure Note  Procedure: Arterial Line Insertion Date & Time: Date & Time: 03/29/2014 6:30 AM   Preparation: preparation  Indications:multiple ABGs and hemodynamic monitoring  Location: right radial Universal Protocol: Universal Protocol: Verbal consent obtained, risks and benefits discussed, patient states understanding of the procedure being performed, required blood products, implants, devices, and special equipment available and Immediately prior to procedure a time out was called to verify the correct patient, procedure, equipment, support staff and site/side marked as required  Consent given by: Consent given by: patient  Patient identity confirmed: Patient identity confirmed by: verbally with patient   Anesthesia: local infiltration and lidocaine 2% without epinephrine   Anesthetic total:  mL Sedation: patient sedated   Procedure Details: abnormal Allen's test, 20 and 1  Post Procedure: dressing applied, normal and Patient tolerated the procedure well with no immediate complications  dressing applied  Post-procedure CMS: normal Comments:                  Central Line - OR Procedure Note  OR Central Line Insertion   Date/Time: 03/29/2014 6:31 AM     Provider Information:  Performed by: Orson EvaERRIEN, Nanea Jared  Authorized by: Haze JustinMADANI, MICHAEL MEHRDAD     Indications:  Indications: Vascular access, Central pressure monitoring and Use of inotropes or medications requiring central access     Checklist:  Pre-placement Checklist  Hand hygiene by all in room/procedure room: yes  CHG applicator used: yes   30 second scrub and 2 minute dry: yes  Operator wore: mask with eye shield, sterile gloves, sterile gown and hat  Maximum sterile barrier precautions: yes  Sterile field maintained during procedure: Sterile field maintained during procedure  Sterile technique maintained while applying dressing: Yes  CHG impregnated site patch placed: Yes  Dressing Date/Time: 03/29/2014 6:32 AM  Ointment applied:  noPre-procedure: landmarks identified   Procedure Details.  Preparation: skin prepped with 2% chlorhexidine  Insertion side: right   Insertion site: jugular  Catheter tip location: Internal jugular vein  Patient position: Trendelenburg  Catheter type: Cordis    Ultrasound guidance: yes    Guidewire removed intact: Yes  Ultrasound guide placement: vessel located, needle entry, guide wire removed and ultrasound image entry  Indications for ultrasound: safety    Reason for insertion: new indication      Number of attempts: 1    Successful placement: yes    Estimated Blood Loss: 5 ml  mL   Attending present: Attending was present      Post-procedure: dressing applied and line sutured  Assessment: blood return through all parts, free fluid flow, placement verified by x-ray and pneumothorax on x-ray   Complications: none  Follow up: portable chest xray ordered  Patient tolerance: Patient tolerated the procedure well with no immediate complications       Comments:   Pulmonary Artery Catheter [facility]):  Pulmonary artery catheter placed, sterile sheath, balloon up and gloves changed  Depth of insertion: 49 mL           Arterial Line Procedure Note  Procedure: Arterial Line Insertion Date & Time: Date & Time: 03/29/2014 6:33 AM   Preparation: preparation  Indications:multiple ABGs and hemodynamic monitoring  Location: left femoral Universal Protocol: Universal Protocol: Verbal consent obtained, risks and benefits discussed, patient states understanding of the procedure being performed, required blood products, implants, devices, and special equipment available and Immediately prior to procedure a time out was called to verify the correct patient, procedure, equipment, support staff and site/side  marked as required  Consent given by: Consent given by: patient  Patient identity confirmed: Patient identity confirmed by: verbally with patient   Anesthesia:    Anesthetic total:  mL Sedation: patient sedated   Procedure Details:  18, Seldinger technique used and 1  Post Procedure: dressing applied, normal and Patient tolerated the procedure well with no immediate complications  dressing applied  Post-procedure CMS: normal Comments:                  Perioperative Echocardiography Report  Date/Time:Date & Time: 03/29/2014 7:34 AM  Universal protocol      Universal Protocol: Verbal consent obtained, risks and benefits discussed, patient states understanding of the procedure being performed and required blood products, implants, devices, and special equipment available  Consent given by: patient  Patient identity confirmed by: verbally with patient Procedure details    Procedure type: Transesophageal Echocardiography (TEE)  Echocardiographer: Eloy EndSANCHEZ/BARAK  Requesting service physician:  [facility]  Unit/Department where procedure was performed: OR  Indications: Cardiac surgery (see comments)  Patient intubated and sedated  Insertion: Easy   Ventricles    LV:  Cavity size: Normal  Cavity dimension: cm  Hypertrophy:   Thrombus:no   Global function: Normal  Regional function: No wall motion abnormalities  EF%:   LV comments:      RV:  Cavity size: Dilated  Cavity dimension:cm  Hypertrophy:   Thrombus: no   Global function: Mild dysfunction  Regional function: No wall motion abnormalities  EF%:   RV comments:     LV Diastolic function    Grade:Normal  E/A ratio:1.1  E-wave deceleration:16150ms  Pulmonary venous flow pattern:Diastolic dominance  E/E' ratio:3.2  LV diastolic function comments:    Atria    LA:  Size: Normal  SEC (smoke):no   Thrombus: Thrombus: no     Tumor: Tumor: no   Devices/catheters:no   LA comments:    RA:  Size: Dilated  SEC: no   Thrombus:no   Tumor: no   Devices (catheters):no   RA comments:     Additional findings:  Left atrial appendage: Normal  Interarterial septum: Normal and Negative bubble study  Interventricular septum:Normal Valves    Aortic (AV):  AV annulus:Normal  AV stenosis: None  AV area/gradient:   AV regurgitation:  None  AV leaflet morphology: Normal  AV leaflet morphology: Normal  AVcomments:     Mitral (MV):  MV annulus: Normal  MV stenosis: None  MV area/gradient:   MV regurgitation: Trace  MV leaflet morphology: Normal  MV leaflet motion: Normal  MV comments:     Tricuspid (TV):  TV annulus: Dilated  TV stenosis: None  TV area/gradient:   TV regurgitation: Trace  TV leaflet morphology Normal  TV leaflet motion: Normal  TV comments:     Pulmonic (PV):  PV annulus: Normal  PV stenosis: None  PV area gradient:   PV regurgitation: None  PV leaflet morphology: Normal  PV leaflet motion: Normal  PV comments:     Great vessels    Ascending aorta:   Size: Normal  Diameter:   Dissection: no   Grade/PA thrombus: 2  Placque mobile: no   Comments:     Aortic arch:  Size: Normal  Diameter: cm  Dissection: no   Grade/PA thrombus: 2  Placque mobile: no   Comments:     Descending Aorta:  Size: Normal  Diameter:  cm  Dissection:  no   Grade/PA thrombus: 2  Placque mobile: no  Comments:     Pulmonary Artery:  Size:   Diameter:   Grade/PA thrombus :  Comments:        Additional findings    Pericardium: Normal  Pleura: Effusion - left  Post intervention follow up study:   Complications:     Echocardiography results discussed with surgical team    Procedure comments:

## 2014-03-29 NOTE — Progress Notes (Addendum)
POST-OPERATIVE DAY #0, S/P PULMONARY THROMBOENDARTERECTOMY  Current Hospital  LOS: 1 day     Subjective:       [place] Classification Level (Right): III  [place] Classification Level (Left): IV    Tubes and wires: A wires x 2, V wires x 2, chest tubes x 3  OR Time  9 hours    CP Bypass Time  233 mins    AO Clamp Time  89 mins    CBP Cooling Time  83 mins    CPB Re-Warming Time  129 mins    Circulatory Arrest Time  Total, 41 mins; Right, 21 mins; Left, 20 mins     Pre-Op (Cath)  Pre-Op (OR)  Post-Op    AP   104/40(56)  89/54(70)    PAP  53/19(30)  35/15(21)  33/22(27)    PVR  239  144  169    CO  6.7  6.1  8.5    CVP  15  13  18         Vitals:   BP  Min: 132/53  Max: 140/62  Temp  Avg: 97.5 F (36.4 C)  Min: 96.4 F (35.8 C)  Max: 98.9 F (37.2 C)  Pulse  Avg: 76.3  Min: 63  Max: 90  Resp  Avg: 15.9  Min: 11  Max: 18  SpO2  Avg: 97.7 %  Min: 92 %  Max: 100 %  Height  Avg: 5\' 11"  (180.3 cm)  Min: 5\' 11"  (180.3 cm)  Max: 5\' 11"  (180.3 cm)  Weight  Avg: 124.2 kg (273 lb 13 oz)  Min: 124.2 kg (273 lb 13 oz)  Max: 124.2 kg (273 lb 13 oz)   could not be evaluated. This SmartLink does not work with rows of the type:     Vent:   Mode: VC/CMV Rate: 16 Vt: 700   FiO2: 100  PEEP: 5    Lab Results   Component Value Date    ARTPH 7.45 03/29/2014    ARTPCO2 32* 03/29/2014    ARTPO2 74 03/29/2014       Exam:   Gen: Intubated and sedated  HEENT: EOMI, PERRLA  CV: RRR, no MRCG  RESP: Coarse breath sounds bilaterally  CHEST: Chest tubes in place, no air leak evident  ABD: soft, NT/ND, NABS  EXT: No LE edema  NEURO: non-focal, grossly intact    Labs:   Lab Results   Component Value Date    NA 139 03/29/2014    K 4.0 03/29/2014    CL 101 03/29/2014    BICARB 20 03/29/2014    BUN 55 03/29/2014    CREAT 2.48 03/29/2014    GLU 234 03/29/2014    [place] 8.8 03/29/2014    MG 2.5 03/29/2014     Lab Results   Component Value Date    WBC 14.4 03/29/2014    RBC 3.11 03/29/2014    HGB 8.9 03/29/2014    HCT 26.6 03/29/2014    MCV 85.5 03/29/2014    MCHC  33.5 03/29/2014    RDW 15.4 03/29/2014    PLT 177 03/29/2014    MPV 10.1 03/29/2014    LYMPHS 6 03/29/2014    MONOS 10 03/28/2014    EOS 1 03/28/2014    BASOS 1 03/28/2014     Lab Results   Component Value Date    PTT 28.2 03/29/2014    INR 1.4 03/29/2014     Imaging:  No results found for this visit on  03/28/14.   ceFUROXime (ZINACEF) IVPB  1,500 mg Q12H NR    famotidine  20 mg Daily    Or    famotidine  20 mg Daily    furosemide  20 mg Q6H     Drips:  Phenylepherine 0 mcg/kg/min  Dopamine 5 mcg/kg/min  Nipride 0 mcg/kg/min  Propofol 40 mcg/kg/min  Insulin 8 units/hr    ASSESSMENT:    PLAN:  #Respiratory: Hypoxemic respiratory failure, borderline oxygenation  - Wean FiO2 to keep SpO2 >94%  -Keep PEEP at 10 overnight  - Serial ABG's with goal pH [7.35-7.40]  - If able to wean to 40% and 5+, plan for SBT in AM    # CV: post-op hemodynamic support  - Dopamine for CI >2.2  - Phenylephrine for MAP >60   - Nipride for goal MAP <90   - Once mediastinal drain output is <30cc/hr x2 consecutive hours, will start anticoagulation with heparin SQ    # Renal: CKD  - Monitor urine output closely   - Continue lasix 20mg  Q6H; keep I=O and CVP [8-10]    #DM: continue insulin gtt    # Prophylaxis:  Antibiotics - Cefuroxime 1500 mg IV Q12H while mediastinal drains in place  Feeds - NPO  Analgesia- Morphine prn  Sedation - Propofol  Head-of-bed elevation - 30 degrees  Ulcer prevention - PPI  Glucose control - Insulin drip  Skin Breakdown- Routine care    # Code Status: Full code/Full care    Patient was seen and discussed with Dr. Sarina IllPapamatheakis who agrees with the assessment and plan as outlined above.     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    See fellow history and physical for further details of the patient's history.    Objective  NAD,  sedated and intubated, RRR, CTAB but coarse on Vent to anterio auscultation, NTND, obese, trace edema  I have examined the patient and concur with the fellow exam.    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  1. CTEPH:  - Hep SQ once chest tube drainage abates  - Wean off pressors and inotropes for CO~2.2  - Wean down Vent settings  2. Acute respiratory failure:  - Borderline ABG initially but significantly improved with PEEP  - Follow and wean FiO2.  3. DM:  - Ins gtt  - Gollow BG  4. CKD from prior nephrectomy:  - BUN/Cr stable  - Follow UO and labs  5. Hypothyroidism and HL:  - Resume Synthroid and statin once extubated  6. OSA:  - Resume CPAP once extubated    I agree with the fellow care plan.  See the resident / fellow note for further details.    I spent 45 minutes providing critical care services.     Karalee Heightemosthenes Jaimarie Rapozo, MD  Pulmonary and Critical Care  PID [id number] / Pager [phone]

## 2014-03-29 NOTE — Brief Op Note (Signed)
CT SURGERY BRIEF OP NOTE - PULMONARY THROMBOENDARTERECTOMY    03/29/2014    PTE case number:  [id number]    Pre-operative diagnosis:  CTEPH, HTN, mild AS, DM2, hypothyroidism, OSA, laparoscopic cholecystectomy, chronic renal failure, Wilms tumor as child resulted in nephrectomy    Post-operative diagnosis:  As above    Procedure:  Bilateral pulmonary thromboendarterectomy    [place] Classification Level (Right): III  [place] Classification Level (Left):  IV    Surgeon:  Dr. Blanche EastMadani  Assistant surgeon:  Dr. Laurann MontanaSakakibara and Samella ParrYan Ko NP  Anesthesia:  Dr. Mordecai MaesSanchez    Tubes and wires:  A wires x 2, V wires x 2, chest tubes x 3    OR Time 9 hours   CP Bypass Time 233 mins   AO Clamp Time 89 mins   CBP Cooling Time 83 mins   CPB Re-Warming Time 129 mins   Circulatory Arrest Time Total, 41 mins; Right, 21 mins; Left, 20 mins       Pre-Op (Cath)  Pre-Op (OR)  Post-Op    AP  104/40(56) 89/54(70)    PAP 53/19(30) 35/15(21) 33/22(27)    PVR 239 144 169    CO 6.7 6.1 8.5    CVP 15 13 18      Pathology specimens:  Pulmonary emboli    IV Fluids Crystalloids 700 mL, Albumin 500 mL, Cell saver 500 mL   Units of RBCs 0   Units of FFP 0   Units of Platelets 0   Urine Output 700 mL   Blood Loss 350 mL     Drips: dopamine and phenylephrine    Complications: None    Wound Classification:  Class I (clean)    Wound Status:  All layers of surgical incision (deep and superficial) were fully closed.     Dispo: Stable to ICU intubated and sedated. AV paced 90 bpm. On Dopamine 5 mcg/kg/min and Phenylephrine 40 mcg/min. Closed chest CI 3.51 L/min/M2.

## 2014-03-29 NOTE — Interdisciplinary (Signed)
Sedation weaned down for neuro assessment and on Propofol 10 mcg/kg/min , pt woke up  And wife spoke with pt, did follow commands x 4- squeeze hands and wiggle toes and shook head for " No pain"-resedated after neuro assessment. Dr. Ilsa IhaScholten text paged re: abg results MD called back willo keep Peep of 10 and weaned down fiO2 , will give iCa replacement for frequent PVC's and will d/c fem.Aline. MD also aware ct output still 25-30 ml/hr.

## 2014-03-29 NOTE — Plan of Care (Signed)
Problem: Tissue Perfusion, Cardiopulmonary - Altered  Goal: Early recognition of deterioration  Outcome:   Pt A+Ox4, afebrile, denies any chest pain or discomfort. No telemetry monitoring. No edema noted. Pedal pulses present. Pt denies SOB with activity in the room. SPO2 98% at RA. Using CPAP at night.     Scheduled for PTE today. CHG bath done. Blood available verified with Blood bank tech FillmoreDomingo, Dorma RussellEdwin. RBCs x 4, PLTx2, FFP x2.     Problem: Blood Glucose, Alteration in  Goal: Glucose levels within specified parameters.  Outcome:  Accucheck HS with Lispro insulin s/s. Pt aware of s/s to report. Tolerated dinner, NPO since midnight. No s/s of hypo/hyperglycemia reported. Will continue to f/u.     Problem: Falls, Risk of  Goal: Keep patient free from falls utilizing universal fall precautions  Outcome:   Patient ambulating in the room and to the bathroom independently.  Instructed about fall precautions and s/s to report. Bed side rails up, wheels locked, Call bell and urinal within reach. Wearing non-skid socks. Pt resting in bed at this time. Bed alarm on after sleeping pill administered. Wife at bedside. No injuries or falls.

## 2014-03-30 DIAGNOSIS — I517 Cardiomegaly: Secondary | ICD-10-CM

## 2014-03-30 DIAGNOSIS — I519 Heart disease, unspecified: Secondary | ICD-10-CM

## 2014-03-30 DIAGNOSIS — I369 Nonrheumatic tricuspid valve disorder, unspecified: Secondary | ICD-10-CM

## 2014-03-30 LAB — ARTERIAL BLOOD GAS
BE, Art: -1.5 mmol/L (ref <=1.2)
BE, Art: -0.8 mmol/L (ref <=1.2)
BE, Art: -0.6 mmol/L (ref <=1.2)
FIO2: 40 %
FIO2: 40 %
FIO2: 40 %
HCO3, Art: 21 mmol/L — ABNORMAL LOW (ref 23–29)
HCO3, Art: 22 mmol/L — ABNORMAL LOW (ref 23–29)
HCO3, Art: 22 mmol/L — ABNORMAL LOW (ref 23–29)
O2 Sat, Art (Est): 95.9 % (ref 94–100)
O2 Sat, Art (Est): 97.7 % (ref 94–100)
O2 Sat, Art (Est): 95.8 % (ref 94–100)
Temp: 37.3 'C
Temp: 37.5 'C
Temp: 37.6 'C
pCO2, Art (T): 30 mmHg — ABNORMAL LOW (ref 36–46)
pCO2, Art (T): 31 mmHg — ABNORMAL LOW (ref 36–46)
pCO2, Art (T): 31 mmHg — ABNORMAL LOW (ref 36–46)
pCO2, Art (Uncorr): 30 mmHg (ref 36–46)
pCO2, Art (Uncorr): 30 mmHg (ref 36–46)
pCO2, Art (Uncorr): 30 mmHg (ref 36–46)
pH, Art (T): 7.46 (ref 7.35–7.46)
pH, Art (T): 7.47 — ABNORMAL HIGH (ref 7.35–7.46)
pH, Art (T): 7.47 — ABNORMAL HIGH (ref 7.35–7.46)
pH, Art (Uncorr): 7.47 (ref 7.35–7.46)
pH, Art (Uncorr): 7.47 (ref 7.35–7.46)
pH, Art (Uncorr): 7.48 (ref 7.35–7.46)
pO2, Art (T): 76 mmHg (ref 74–109)
pO2, Art (T): 97 mmHg (ref 74–109)
pO2, Art (T): 76 mmHg (ref 74–109)
pO2, Art (Uncorr): 74 mmHg (ref 74–109)
pO2, Art (Uncorr): 94 mmHg (ref 74–109)
pO2, Art (Uncorr): 73 mmHg (ref 74–109)

## 2014-03-30 LAB — CBC WITH DIFF, BLOOD
ANC-Automated: 13.9 10*3/uL — ABNORMAL HIGH (ref 1.6–7.0)
ANC-Automated: 14.7 10*3/uL — ABNORMAL HIGH (ref 1.6–7.0)
ANC-Automated: 16.1 10*3/uL — ABNORMAL HIGH (ref 1.6–7.0)
Abs Lymphs: 0.8 10*3/uL (ref 0.8–3.1)
Abs Lymphs: 0.9 10*3/uL (ref 0.8–3.1)
Abs Lymphs: 0.8 10*3/uL (ref 0.8–3.1)
Abs Monos: 0.3 10*3/uL (ref 0.2–0.8)
Abs Monos: 0.4 10*3/uL (ref 0.2–0.8)
Abs Monos: 0.8 10*3/uL (ref 0.2–0.8)
Hct: 28.9 % — ABNORMAL LOW (ref 40.0–50.0)
Hct: 28.2 % — ABNORMAL LOW (ref 40.0–50.0)
Hct: 29.1 % — ABNORMAL LOW (ref 40.0–50.0)
Hgb: 9.8 gm/dL — ABNORMAL LOW (ref 13.7–17.5)
Hgb: 9.3 gm/dL — ABNORMAL LOW (ref 13.7–17.5)
Hgb: 9.5 gm/dL — ABNORMAL LOW (ref 13.7–17.5)
Imm Gran Abs: 0.1 10*3/uL (ref <0.1)
Imm Gran Abs: 0.1 10*3/uL (ref <0.1)
Imm Gran Abs: 0.1 10*3/uL (ref <0.1)
Lymphocytes: 5 % — ABNORMAL LOW (ref 19–53)
Lymphocytes: 6 % — ABNORMAL LOW (ref 19–53)
Lymphocytes: 4 % — ABNORMAL LOW (ref 19–53)
MCH: 28.7 pg (ref 26.0–32.0)
MCH: 28.2 pg (ref 26.0–32.0)
MCH: 28.1 pg (ref 26.0–32.0)
MCHC: 33.9 % (ref 32.0–36.0)
MCHC: 33 % (ref 32.0–36.0)
MCHC: 32.6 % (ref 32.0–36.0)
MCV: 84.8 um3 (ref 79.0–95.0)
MCV: 85.5 um3 (ref 79.0–95.0)
MCV: 86.1 um3 (ref 79.0–95.0)
MPV: 10.6 fL (ref 9.4–12.4)
MPV: 10.5 fL (ref 9.4–12.4)
MPV: 10.9 fL (ref 9.4–12.4)
Monocytes: 2 % — ABNORMAL LOW (ref 5–12)
Monocytes: 3 % — ABNORMAL LOW (ref 5–12)
Monocytes: 5 % (ref 5–12)
Plt Count: 205 10*3/uL (ref 140–370)
Plt Count: 191 10*3/uL (ref 140–370)
Plt Count: 191 10*3/uL (ref 140–370)
RBC: 3.41 10*6/uL — ABNORMAL LOW (ref 4.60–6.10)
RBC: 3.3 10*6/uL — ABNORMAL LOW (ref 4.60–6.10)
RBC: 3.38 10*6/uL — ABNORMAL LOW (ref 4.60–6.10)
RDW: 15.5 % — ABNORMAL HIGH (ref 12.0–14.0)
RDW: 15.8 % — ABNORMAL HIGH (ref 12.0–14.0)
RDW: 15.9 % — ABNORMAL HIGH (ref 12.0–14.0)
Segs: 92 % — ABNORMAL HIGH (ref 34–71)
Segs: 91 % — ABNORMAL HIGH (ref 34–71)
Segs: 91 % — ABNORMAL HIGH (ref 34–71)
WBC: 15.1 10*3/uL — ABNORMAL HIGH (ref 4.0–10.0)
WBC: 16.1 10*3/uL — ABNORMAL HIGH (ref 4.0–10.0)
WBC: 17.8 10*3/uL — ABNORMAL HIGH (ref 4.0–10.0)

## 2014-03-30 LAB — BASIC METABOLIC PANEL, BLOOD
Anion Gap: 16 mmol/L — ABNORMAL HIGH (ref 7–15)
Anion Gap: 15 mmol/L (ref 7–15)
BUN: 50 mg/dL — ABNORMAL HIGH (ref 6–20)
BUN: 43 mg/dL — ABNORMAL HIGH (ref 6–20)
Bicarbonate: 21 mmol/L — ABNORMAL LOW (ref 22–29)
Bicarbonate: 22 mmol/L (ref 22–29)
Calcium: 9.4 mg/dL (ref 8.5–10.6)
Calcium: 9.6 mg/dL (ref 8.5–10.6)
Chloride: 104 mmol/L (ref 98–107)
Chloride: 106 mmol/L (ref 98–107)
Creatinine: 2.75 mg/dL — ABNORMAL HIGH (ref 0.67–1.17)
Creatinine: 2.52 mg/dL — ABNORMAL HIGH (ref 0.67–1.17)
GFR: 25 mL/min
GFR: 27 mL/min
Glucose: 160 mg/dL — ABNORMAL HIGH (ref 70–115)
Glucose: 127 mg/dL — ABNORMAL HIGH (ref 70–115)
Potassium: 4 mmol/L (ref 3.5–5.1)
Potassium: 3.9 mmol/L (ref 3.5–5.1)
Sodium: 141 mmol/L (ref 136–145)
Sodium: 143 mmol/L (ref 136–145)

## 2014-03-30 LAB — 2D ECHO WITH IMAGE ENHANCEMENT AGENT IF NECESSARY
LA Volume Index: 38 mL/m2 — ABNORMAL HIGH (ref 16–28)
LV Ejection Fraction: 40 % — ABNORMAL LOW (ref >50)

## 2014-03-30 LAB — PROTHROMBIN TIME, BLOOD
INR: 1.3
PT,Patient: 13.5 s — ABNORMAL HIGH (ref 9.7–12.5)

## 2014-03-30 LAB — PHOSPHORUS, BLOOD
Phosphorous: 1.9 mg/dL — ABNORMAL LOW (ref 2.7–4.5)
Phosphorous: 4 mg/dL (ref 2.7–4.5)

## 2014-03-30 LAB — GLUCOSE (POCT)
Glucose (POCT): 161 mg/dL — ABNORMAL HIGH (ref 70–115)
Glucose (POCT): 161 mg/dL — ABNORMAL HIGH (ref 70–115)
Glucose (POCT): 155 mg/dL — ABNORMAL HIGH (ref 70–115)
Glucose (POCT): 152 mg/dL — ABNORMAL HIGH (ref 70–115)
Glucose (POCT): 138 mg/dL — ABNORMAL HIGH (ref 70–115)
Glucose (POCT): 134 mg/dL — ABNORMAL HIGH (ref 70–115)
Glucose (POCT): 138 mg/dL — ABNORMAL HIGH (ref 70–115)
Glucose (POCT): 138 mg/dL — ABNORMAL HIGH (ref 70–115)
Glucose (POCT): 140 mg/dL — ABNORMAL HIGH (ref 70–115)
Glucose (POCT): 122 mg/dL — ABNORMAL HIGH (ref 70–115)
Glucose (POCT): 114 mg/dL (ref 70–115)
Glucose (POCT): 122 mg/dL — ABNORMAL HIGH (ref 70–115)
Glucose (POCT): 129 mg/dL — ABNORMAL HIGH (ref 70–115)
Glucose (POCT): 146 mg/dL — ABNORMAL HIGH (ref 70–115)

## 2014-03-30 LAB — LIVER PANEL, BLOOD
ALT (SGPT): 51 U/L — ABNORMAL HIGH (ref 0–41)
AST (SGOT): 167 U/L — ABNORMAL HIGH (ref 0–40)
Albumin: 3.8 g/dL (ref 3.5–5.2)
Alkaline Phos: 28 U/L — ABNORMAL LOW (ref 40–129)
Bilirubin, Dir: <0.2 mg/dL (ref <0.2)
Bilirubin, Tot: 0.5 mg/dL (ref <1.20)
Total Protein: 6.8 g/dL (ref 6.0–8.0)

## 2014-03-30 LAB — MRSA SURVEILLANCE CULTURE

## 2014-03-30 LAB — MAGNESIUM, BLOOD
Magnesium: 2.4 mg/dL (ref 1.7–2.6)
Magnesium: 2.4 mg/dL (ref 1.7–2.6)

## 2014-03-30 LAB — POTASSIUM, ART WHOLE BLOOD: Potassium, Art: 4.1 mmol/L (ref 3.5–5.0)

## 2014-03-30 LAB — POTASSIUM, BLOOD: Potassium: 3.9 mmol/L (ref 3.5–5.1)

## 2014-03-30 LAB — CALCIUM, IONIZED BLOOD: Ca, Ionized: 1.09 mMol/L — ABNORMAL LOW (ref 1.13–1.32)

## 2014-03-30 LAB — APTT, BLOOD: PTT: 28.6 s (ref 25.0–34.0)

## 2014-03-30 LAB — CALCIUM, IONIZED, ARTERIAL: Ca Ionized, Art: 1.14 mmol/L (ref 1.13–1.32)

## 2014-03-30 MED ORDER — INSULIN GLARGINE 100 UNIT/ML SC SOLN
60.0000 [IU] | Freq: Two times a day (BID) | SUBCUTANEOUS | Status: DC
Start: 2014-03-30 — End: 2014-04-02
  Administered 2014-03-30 – 2014-04-01 (×5): 60 [IU] via SUBCUTANEOUS
  Filled 2014-03-30 (×6): qty 60

## 2014-03-30 MED ORDER — SODIUM PHOSPHATE 10 MEQ/50 ML D5W
10.00 meq | Status: AC
Start: 2014-03-30 — End: 2014-03-30
  Administered 2014-03-30 (×2): 10 meq via INTRAVENOUS
  Filled 2014-03-30 (×2): qty 50

## 2014-03-30 MED ORDER — LEVOTHYROXINE SODIUM 75 MCG OR TABS
75.0000 ug | ORAL_TABLET | Freq: Every day | ORAL | Status: DC
Start: 2014-03-30 — End: 2014-04-13
  Administered 2014-03-30 – 2014-04-13 (×15): 75 ug via ORAL
  Filled 2014-03-30 (×15): qty 1

## 2014-03-30 MED ORDER — CALCIUM GLUCONATE 1000 MG/60 ML D5W
1.00 g | Freq: Once | Status: AC
Start: 2014-03-30 — End: 2014-03-30
  Administered 2014-03-30: 1 g via INTRAVENOUS
  Filled 2014-03-30: qty 50

## 2014-03-30 MED ORDER — GLUCOSE 4 GM PO CHEW (CUSTOM)
4.0000 | CHEWABLE_TABLET | ORAL | Status: DC | PRN
Start: 2014-03-30 — End: 2014-04-13

## 2014-03-30 MED ORDER — DEXTROSE 50 % IV SOLN
12.5000 g | INTRAVENOUS | Status: DC | PRN
Start: 2014-03-30 — End: 2014-04-13

## 2014-03-30 MED ORDER — GLUCAGON HCL (RDNA) 1 MG IJ SOLR
1.0000 mg | Freq: Once | INTRAMUSCULAR | Status: DC | PRN
Start: 2014-03-30 — End: 2014-04-13

## 2014-03-30 MED ORDER — NITROPRUSSIDE SODIUM 25 MG/ML IV SOLN
0.0000 ug/kg/min | INTRAVENOUS | Status: DC
Start: 2014-03-30 — End: 2014-03-30
  Administered 2014-03-30: 0.25 ug/kg/min via INTRAVENOUS
  Filled 2014-03-30: qty 2

## 2014-03-30 MED ORDER — DEXTROSE (DIABETIC USE) 40 % OR GEL
1.0000 | ORAL | Status: DC | PRN
Start: 2014-03-30 — End: 2014-04-13

## 2014-03-30 MED ORDER — CALCIUM GLUCONATE 1000 MG/60 ML D5W
1.00 g | Freq: Once | Status: AC
Start: 2014-03-30 — End: 2014-03-30
  Administered 2014-03-30: 1 g via INTRAVENOUS

## 2014-03-30 NOTE — Plan of Care (Signed)
Problem: Falls, Risk of  Goal: Keep patient free from falls utilizing universal fall precautions  Outcome: Met  Pt adequately sedated on Propofol gtt. On high fall risk, fall precautions maintained. Did followed commands when sedation off for neuro assessment.    Problem: Discharge Planning  Goal: Participation in care planning  Outcome: Not Met  Pt just had his PTE surgery this AM, remains in ICU intubated,sedated, on pressors and inotropes. Wife at bedside updated with POC for the shift such as monitor for bleeding and blood sugar, wean down fiO2 as tolerated, reposition/turning every 2 hrs , etc.Wife  did demonstrates understanding of POC,  Goal: Verbalizes/demonstrates knowledge of discharge instructions  Outcome: Not Met  Goal: Verbalize knowledge of prescribed medication management plan  Outcome: Not Met  Goal: Able to perform ADL- independently or with minimal assist  Outcome: Not Met    Problem: Pain - Acute  Goal: Communication of presence of pain  Outcome: Met  Pain assessment done every hour using CPOT. Did gave Fenatnyl IV PRN  For pain control with good outcome.  Goal: Control of acute pain  Outcome: Met  Goal: Knowledge of pain management methods  Outcome: Not Met  Pt sedated and intubated no health teachings provided .    Problem: Skin Integrity- Intact patient high risk for impaired skin integrity Braden scale less than or equal to 18  Goal: Skin remains intact  Outcome: Met  Pt turned and posterior skin assessed for signs of redness, skin is completely intact, no areas of redness noted.  Preventive foam adhesive dressing in place to sacrum, peeled back and skin assessed, no redness under dressing noted.  Dressing reapplied. Pt on air mattress and been turned every 2 hrs tolerating well.      Goal: Knowledge of skin protection measures  Outcome: Met  Health teachings provided to wife because pt.is sedated and intubated.    Problem: Skin Integrity Impaired: Secondary to pressure ulcer(admission or  hospital acquired), cellulitis, incontinence, surgical wound/incision, chronic wound or skin disease  Goal: Absence of infection signs and symptoms  Change when soiled, integrity impaired, physican order, or, no longer than 7 days.   Outcome: Met  No fever noted, antibiotics continued as ordered. Mid-sternal incision with Prevena wound vac no output noted. Mediastinal CT dressing been CDI not due to be changed.  Goal: Decrease in wound size: 10-15% weekly  Outcome: Not Met  Goal: Prevent further skin compromise  Outcome: Met

## 2014-03-30 NOTE — Consults (Signed)
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 Asked by Dr. Cristal Deer to provide opinion regarding treatment options for diabetes management.    DM2 since 1995, home regimen includes insulin U-500 TID 30u/28u/20u markings administered at 0930,1130, and 1830 respectively. Pt and wife [facility]) stated that doses have been adjusted, but could not precise by how much. Pt does not miss insulin doses. Reported hypoglycemic episodes about 1-2 X week usually around 0230 in the 40s; treats them with PBJ sandwich, crackers , granola, or milk. Denied retinopathy, sees a nephrologist q68mofor nephropathy, and feels tingling in both legs when sitting down after a long day at work. Monitors BGs TID with 80% of values about 120. Unknown meter make/model; believes it to be Bayer, but from product description it appears to be Verio. Pt has sufficient test supplies. Inpt BG readings today between114 and 173; currently on insulin drip.    Beverage of choice is water and diet sodas. Enjoys MPolandfood. [place] BRicardo Jericho. Has attended diabetes education sessions about 5 years ago. He uses the treadmill 2X week and needs oxygen.    ROS:  no f/c, weight gain/loss, chest pain/palpitations, SOB, cough, abd pain, n/v, diarrhea/const, BRBPR/melena, dysuria/hematuria, orthopnea/PND/LEE. No polyuria, polydipsia or blurry vision.  No numbness, tingling, pain or new lesions B feet.    PMHx:  No past medical history on file.    PSHx:  No past surgical history on file.    SocHx:  T: denies  E: denies  D: denies    FamHx:positive for diabetes  ALL: Vicodin    MEDS:  Current Facility-Administered Medications      Medication    acetaminophen (TYLENOL) solution 650 mg    ceFUROXime (ZINACEF) 1,500 mg in sodium chloride 0.9 % 50 mL IVPB    dextrose 50 % solution 12.5 g    dextrose-sodium chloride 5%-0.45% infusion    fentaNYL injection 25 mcg    glucagon (GLUCAGON) injection 1 mg    glucose chewable tablet 16 g    glucose oral gel 1 Tube    heparin injection 5,000 Units    HYDROcodone-acetaminophen (NORCO) 10-325 MG tablet 1 tablet    HYDROcodone-acetaminophen (NORCO) 5-325 MG tablet 1 tablet    HYDROcodone-acetaminophen (NORCO) 5-325 MG tablet 1 tablet    insulin glargine (LANTUS) injection 60 Units    insulin regular (HUMULIN,NOVOLIN) 100 Units in sodium chloride 0.9 % 100 mL infusion    And    insulin regular (HUMULIN,NOVOLIN) IV bolus from bag 1-15 Units    levothyroxine (SYNTHROID) tablet 75 mcg    nalOXone (NARCAN) injection 0.1 mg       PHYSICAL EXAM  Temperature:  [98.7 F (37.1 C)-99.7 F (37.6 C)] 98.7 F (37.1 C) (10/30 1700)  Blood pressure (BP): (113-163)/(57-84) 147/68 mmHg (10/30 2100)  Heart Rate:  [62-82] 72 (10/30 2100)  Respirations:  [8-27] 14 (10/30 2100)  Pain Score:  [-] Patient Sleeping, Respiratory Assessment Done (10/30 2100)  O2 Device:  [-] Nasal cannula (10/30 2100)  O2 Flow Rate (L/min):  [2 l/min-6 l/min] 2 l/min (10/30 2100)  SpO2:  [91 %-100 %] 97 % (10/30 2100)  Body mass  index is 34.21 kg/(m^2).    GEN: NAD  HENT: NC/AT, OP clear  Eyes: PERRL, EOMI  NECK: supple, no LAD  CV: RRR  RESP: breathing comfortably  ABD: soft, ND  EXT: no edema  NEURO: no tremor    LABS:  Lab Results   Component Value Date    A1C 8.2 03/29/2014     Lab Results   Component Value Date    NA 143 03/30/2014    K 3.9 03/30/2014    CL 106 03/30/2014    BICARB 22 03/30/2014    BUN 43 03/30/2014    CREAT 2.52 03/30/2014    GLU 127 03/30/2014    [place] 9.6 03/30/2014     No results found for: CHOL, HDL, LDLCALC, TRIG, LDLDIRECT  Lab Results   Component Value Date    AST 167 03/30/2014    ALT 51 03/30/2014     ALK 28 03/30/2014    TP 6.8 03/30/2014    ALB 3.8 03/30/2014    TBILI 0.50 03/30/2014    DBILI <0.2 03/30/2014       Diet:Full liquid carb limited    Blood Sugars reviewed:                            AM      Lu        Di         HS       O/N  10.30.15 BGs between 114-173 on insulin drip    A/P:  Edwin Sandoval is a 48 year old male with 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 A1C=8.2%(10/29). Inpt BGs between 114-173 on insulin drip at 70m/hr over the last 4 hours. Transition to SC insulin to follow.    Inpatient Recs:(Dr. JJodi Mourningcalled and relayed recs to primary team)  - Start Lantus 60 units BID; first dose at 2100 and second dose at 0900; insulin drip to be turned off after second dose  - Start Lispro 40 units qac when starting carb limited diet (RN to give 0, 1/2, whole dose based on % tray consumed)  - Start Lispro custom dose scale ACHS as follows:  150-175 6  176-200 12  201-225 18  226-250 24  251-275 30  276-300 36  >300  42 units    Outpatient Recs  - Resume oupt insulin regimen; doses TBD pending BG trends during hospital stay  - Follow-up with PCP within 1-2 weeks of d/c    Education:    1. Self-monitor blood glucose:does have a BG meter and monitors 3 X day; has sufficient supplies; need to clarify make/model; believes it to be Bayer    2. Healthy Eating:enjoys MPolandfood; to address carb content and distribution during the course of hospital stay    3. Activity: walks on treadmill 2X week with oxygen    4. Hypoglycemia:1-2X week; educated about hypoglycemia etiology, s&s, and treament    5. Medication: Pt understands U-500 insulin home regimen    Thank you for this interesting consult, will cont to follow    AMonica Becton Diabetes/Endocrine Nurse Educator

## 2014-03-30 NOTE — Op Note (Signed)
Dictating Practitioner: Guilford Shi, M.D.     Staff Physician:  Guilford Shi, M.D.    Date of Operation:  03/29/2014        PREOPERATIVE DIAGNOSES  1. Chronic thromboembolic pulmonary vascular disease.  2. 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  3. History of hypertension.  4. History of diabetes.  5. Morbid obesity.  6. Hypothyroidism.  7. Obstructive sleep apnea.  8. History of chronic renal insufficiency with baseline creatinine in the  2.5 range.  9. 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  10. History of laparoscopic cholecystectomy.    POSTOPERATIVE DIAGNOSES  1. Chronic thromboembolic pulmonary vascular disease.  2. 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  3. History of hypertension.  4. History of diabetes.  5. Morbid obesity.  6. Hypothyroidism.  7. Obstructive sleep apnea.  8. History of chronic renal insufficiency with baseline creatinine in the  2.5 range.  9. 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  10. History of laparoscopic cholecystectomy.    PROCEDURE PERFORMED  1. Median sternotomy and cardiopulmonary bypass with profound hypothermic  circulatory arrest.  2. Right pulmonary endarterectomy.  3. Left pulmonary endarterectomy.  4. Intraoperative transesophageal echocardiogram.    SURGEON/STAFF: Elmer Sow. Blanche East, MD    ASSISTANT: Harlen Labs, MD    ANESTHESIA: General with endotracheal intubation.    ANESTHESIOLOGIST: Theodis Shove, MD    PULMONARY ENDARTERECTOMY CASE NUMBER: [id number].    DISEASE LEVEL  [place] classification disease level for the right side, level III.  [place] classification disease level for the left side, level IV.    TUBES: A #24-French mediastinal drain x3.    WIRES: Temporary atrial and ventricular pacing wires.    OPERATING TIMES  Total Operating Room Time: Approximately 9 hours.  Total Cardiopulmonary Bypass Time: 233 minutes.  Cardiopulmonary Bypass Cooling Time: 83 minutes.  Cardiopulmonary Bypass Rewarming  Time: 129 minutes.  Aortic Cross-Clamp Time: 89 minutes.  Total Circulatory Arrest Time: 41 minutes.  Circulatory Arrest Time for the Right Side: 21 minutes.  Circulatory Arrest Time for the Left Side: 20 minutes.    HEMODYNAMIC SUMMARY:                     Pre-Op (Cath)     Pre-Op (OR)        Post-Op  AP                                   104/40(56)        89/54(70)  PAP                  53/19(30)        35/15(21)        33/22(27)  PVR                     239              144              169  CO                      6.7              6.1              8.5  CVP  15               13               18       DEGREE OF DIFFICULTY: This patient presented with an unusual degree of  difficulty mainly because of the location of the thromboembolic disease. He  is a large habitus patient with morbid obesity, and the thromboembolic  disease was distal in the segmental and subsegmental branches only.  Endarterectomy was performed under profound hypothermic circulatory arrest  with meticulous dissection individually in each one of these segmental and  subsegmental branches. This operation was performed by 2 attending  cardiothoracic surgeons as there was no suitably qualified resident to  assist and took approximately 9 hours to complete.    DETAILS OF PROCEDURE: After an informed consent was obtained, the patient  was brought to the Operating Room and placed supine on the operating table.  At this point general anesthesia was administered and appropriate  monitoring lines and catheters were then placed.  The patient's chest,  abdomen and both legs were then prepped and draped in the usual standard  surgical manner.    A median sternotomy incision was then made and carried through the skin  into subcutaneous tissue down to the level of the sternum.  The sternum was  then incised using a sternotomy saw.  Hemostasis was established.  There  were large subcutaneous soft tissue venous collaterals that had  formed.  These were all meticulously clipped for hemostasis.  At this point, the  patient was fully heparinized.    A high ascending aortic cannulation was then performed and bicaval  cannulation was also then performed through the right atrium and the  patient was placed on full cardiopulmonary bypass.  Body's core temperature  as measured by both bladder and rectal temperature, was then actively  cooled down. A pulmonary artery vent was also then placed in the main  pulmonary artery while cooling.  Once the heart fibrillated, an additional  vent was placed within the left atrium toward the left ventricle via right  superior pulmonary vein.    At this point, we continued our dissection of the superior vena cava,  aorta, right pulmonary artery.  The superior vena cava and inferior vena  cava were isolated using tourniquets and the pulmonary artery and aorta  were dissected free.    Once  the patient's temperature was adequately cooled to around 20 degrees  Celsius, We proceeded with the endarterectomy portion of the procedure.    At this point aortic cross clamp was applied and cold blood cardioplegic  solution was administered in the aortic root.  Cardiac diastolic asystole  was then immediately established.   Additional myocardial protection was  provided using a cooling jacket around the heart which remained in place  for the entire procedure. With aortic cross clamp placed, both superior and  inferior vena cava were then isolated using tourniquets.    A modified cerebellar retractor was then placed between the aorta and the  superior vena cava, retracting the superior vena cava laterally.  The  pulmonary artery was then identified posteriorly at this location.  An  arteriotomy was performed in the mid of the pulmonary artery.  This was  continued towards the descending pulmonary artery.    The vessel was then carefully inspected and a plane of dissection was then  identified. Upon commencing the endarterectomy,  circulatory arrest was  initiated to better visualize  the distal pulmonary vasculature.   Indeed,  there was evidence of significant thromboembolic disease.  These planes  were individually raised and isolated and all the specimen was then removed  with a complete endarterectomy of the entire pulmonary vascular tree.  The  endarterectomy specimen appeared to be fragile and extra meticulous care  was taken to ensure that all the thromboembolic material was removed from  distal vessels. Once endarterectomy was completed, the arteriotomy was  closed using 6-0 Prolene suture in a double layer of fashion.    We then turned our attention to the left pulmonary artery. The heart was  retracted medially.   An arteriotomy was performed in the left pulmonary  artery and directed towards the descending pulmonary artery.  Circulatory  arrest was then initiated and similarly a full endarterectomy was performed  on the left side.  Indeed there was significant amount of thromboembolic  material. Once complete endarterectomy was performed, circulation was  re-established and the patient's temperature was rewarmed.  Arteriotomy was  then closed with 6-0 Prolene suture in a running fashion.  Once the  arteriotomy was closed, the heart was returned to its normal position.    Next the tourniquets were removed and de-airing maneuvers were performed.  The left atrial vent was then removed and the right superior pulmonary vein  was closed with a 4.0 Prolene suture.  Lidocaine was then administered.  Further de-airing maneuvers were then performed and the cross clamp was  then removed.    At this point, we waited until the patient's temperature was adequately  warm. During rewarming, all additional cannulation sutures, temporary  atrial and ventricular pacing wires and mediastinal drains were all placed.      Once the patient's temperature was adequately rewarmed to about 36 degrees  celsius, we were ready to come off cardiopulmonary  bypass.  Dopamine  started at 5 mcg/kilo/min and the patient was able to wean from  cardiopulmonary bypass without difficulty.  Hemodynamic numbers were then  re-measured and they were all excellent with significant improvement. And  at this point Protamine was administered.  Hemostasis was assured.  We had  excellent hemostasis.  All the cannulae were then removed and the sites  were oversewn. Again, we had excellent hemodynamic numbers with good  hemostasis.  We were ready for closure.  The mediastinum was irrigated with  antibiotic irrigation multiple times and we proceeded with closure.    The sternum was approximated with standard closure technique using  stainless steel sternal wires and the wound was closed in layers with a  subcuticular closure for the skin.  Appropriate dressing was then applied.  Sponge, instrument and needle counts were all correct at the end of the  case. The patient was then transferred to the Intensive Care Unit in a  stable condition. I was present throughout the entire operation. This  operation was performed by two attending cardiothoracic surgery attending  physicians, as there was no suitably qualified resident to assist. Dr.  Laurann MontanaSakakibara is an attending cardiothoracic surgeon.                          Electronically signed by:  Guilford ShiMICHAEL MEHRDAD Dallis Darden, M.D. 04/03/2014 06:02 A    DD: 03/29/2014    DT: 03/30/2014 07:43 A   DocNo.: [id number]  [id number]/r10           [id number].[facility]    Referring Physician:  [facility]ADANI  cc:

## 2014-03-30 NOTE — Progress Notes (Signed)
A pacing wires x 2 and V pacing wires x 2 removed.  Bedrest for 2 hours

## 2014-03-30 NOTE — Progress Notes (Addendum)
Daily Progress Note:  03/30/2014     Current Hospital Stay:   2 days - Admitted on: 03/28/2014    Subjective:  Intubated, awake, sprinting    Objective:  Tmax 99.7, NSR 60-70, MAP 70's, sp02 95 on CPAP 40%/5  Dopamine 2, Nipride 0.5  PAP 22/14, CVP 7-13, PVR 132, CI 3.2  Insulin gtt    Vital Signs:  Temperature:  [96.4 F (35.8 C)-99.7 F (37.6 C)] 99.7 F (37.6 C) (10/30 1100)  Blood pressure (BP): (113)/(57) 113/57 mmHg (10/29 2215)  Heart Rate:  [62-90] 73 (10/30 1100)  Respirations:  [8-22] 19 (10/30 1100)  Pain Score:  [-] 5 (10/30 1100)  O2 Device:  [-] ETT (10/30 1100)  SpO2:  [92 %-100 %] 95 % (10/30 1100)    Wt Readings from Last 1 Encounters:   03/30/14 111.2 kg (245 lb 2.4 oz)       Intake/Output (Current Shift):  CT 370  UO 3485  -1486    Physical Exam:  General Appearance: intubated, awake, following commands, sprinting.  Heart:  normal rate and regular rhythm, no murmurs, clicks, or gallops.  Lungs: clear to auscultation and percussion, no chest deformities noted.  Abdomen: Abdomen soft, non-tender. No masses or organomegaly. Bowel sounds normal.  Extremities:  no cyanosis, clubbing, or edema.  Skin:  Incisions CDI.    Laboratory data:   Lab Results   Component Value Date    NA 141 03/30/2014    K 3.9 03/30/2014    CL 104 03/30/2014    BICARB 21* 03/30/2014    BUN 50* 03/30/2014    CREAT 2.75* 03/30/2014    GLU 160* 03/30/2014    [place] 9.4 03/30/2014     Lab Results   Component Value Date    WBC 16.1* 03/30/2014    HGB 9.3* 03/30/2014    HCT 28.2* 03/30/2014    PLT 191 03/30/2014    SEG 91* 03/30/2014    BAND 10 03/29/2014    LYMPHS 6* 03/30/2014    MONOS 3* 03/30/2014    EOS 1 03/28/2014     Lab Results   Component Value Date    AST 167* 03/30/2014    ALT 51* 03/30/2014    ALK 28* 03/30/2014    TBILI 0.50 03/30/2014    DBILI <0.2 03/30/2014    TP 6.8 03/30/2014    ALB 3.8 03/30/2014     Lab Results   Component Value Date    INR 1.3 03/30/2014    PTT 28.6 03/30/2014     Lab Results   Component Value  Date    ARTPH 7.47* 03/30/2014    ARTPO2 76 03/30/2014    ARTPCO2 31* 03/30/2014       Assessment and Plan:  POD #1 s/p Bilateral pulmonary thromboendarterectomy, HD stable on Dopa 2, Nipride 0.5, wean as tol.  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 2.7 (2.5 baseline), UO 3.4L  -Consider dc pw's if not in use, Keep CT's with 370/24h drainage  -Cont ICU care per PTE service    Addendum:  Patient evaluated and discussed with NP/PA.  I agree with the above note and plan.  Extubated.  Pacing wires out, continue chest tubes.  Continue care with PTE service.

## 2014-03-30 NOTE — Plan of Care (Signed)
Problem: Tissue Perfusion, Cardiopulmonary - Altered  Goal: Early recognition of deterioration  Outcome: Met  Hemodynamics and respiratory parameters stable throughout shift.  Progressing toward ICU discharge.    Problem: Falls, Risk of  Goal: Keep patient free from falls utilizing universal fall precautions  Outcome: Met  Bed low, brakes on, siderails up x 2, non-skid footwear on, call bell and bedside table within reach.  Patient educated regarding fall prevention measures, including calling for assistance with repositioning, reaching personal items, toileting and transfers.  Patient confirms understanding and agrees to request assistance as directed.            Problem: Alteration in Blood Glucose  Goal: Glucose level within specified parameters  Outcome: Met  Diabetes nurse educator consulted today for insulin regimen and blood sugar management.  Discussed with patient his usual medication and dietary regimen, including recurrent hypoglycemic episodes commonly at 2:30 a.m.  Insulin drip per protocol continued until tomorrow, with SQ coverage to be initiated tonight at 2100.     Problem: Discharge Planning  Goal: Participation in care planning  Outcome: Met  Pt oriented to current status and plan of care; participative and cooperative with all interventions.  Questions encouraged and addressed.  Pt updated with status changes and corresponding changes to care plan.  Goal: Verbalizes/demonstrates knowledge of discharge instructions  Outcome: Not Met  Deferred; no discharge plan yet.  Goal: Verbalize knowledge of prescribed medication management plan  Outcome: Not Met  Deferred until discharge medication plan in place.  Goal: Able to perform ADL- independently or with minimal assist  Outcome: Not Met  OOB to chair with minimal assist at end of this shift, with improved stability, balance and strength.  Independent oral care this afternoon.      Problem: Pain - Acute  Goal: Communication of presence of  pain  Outcome: Met  Uses 0-10 scale to quantify pain & evaluate medication effectiveness.  Goal: Control of acute pain  Outcome: Met  Patient educated regarding pain control, including pain medication around-the-clock for the first 24 hours postop.  Tolerates 5/10; Norco given for higher pain scores, with good effect.  Goal: Knowledge of pain management methods  Outcome: Met  Cognizant of pain control goals, medications available and need to intervene when pain first begins to build from tolerable level rather than waiting until pain is intolerable.    Problem: Bleeding, Risk of  Goal: Absence of active bleeding  Outcome: Met  Two stable CBCs this shift.  Slight increase in CT output with OOB to chair.  No signs of active bleeding.  Goal: Absence of impaired coagulation signs and symptoms  Outcome: Met  SQ heparin per orders.  Goal: Knowledge of bleeding precautions  Outcome: Met  History of anticoagulant use; familiar with foods to avoid when on warfarin as well as bleeding precautions.    Problem: Breathing Pattern - Ineffective  Goal: Respiratory rate, rhythm and depth return to baseline  Outcome: Met  Respirations regular and quiet and breath sounds clear; IS with excellent technique, good volumes and substantial coaching/reminders from family members at bedside.    Problem: Skin Integrity- Intact patient high risk for impaired skin integrity Braden scale less than or equal to 18  Goal: Skin remains intact  Outcome: Met  Air mattress overlay for skin breakdown risk reduction.  Skin assessed head to toe.  Mepilex foam dressing in place over sacral spine; peeled back for skin assessment under dressing; skin intact and dressing re-applied.  Skin examined over  bony prominences and under devices.  No evidence of pressure injury.  Patient positioned with foam wedges and pillows to offload pressure to bony prominences and turned every 2 hours with minimal to no time supine while in bed; mobilized to chair in afternoon.   Heels elevated off mattress surface when in bed and pt advised to shift weight periodically to alleviate pressure points.

## 2014-03-30 NOTE — Plan of Care (Signed)
Problem: Tissue Perfusion, Cardiopulmonary - Altered  Goal: Early recognition of deterioration  Outcome: Met  Pt been closely monitored for any acute changes in assessment. Pt was A paced @ 80 bpm AAI  having frequent PVC's early on shift low iCa replaced as ordered and did go down on pacer back up rate to 60 bpm and thus lessened PVC's . Pt remains on Dopamine gtt to keep CI > 2.2 and on/off Neo gtt to keep Map 65-85 mmHg. Adequate uo Lasix held at [facility].    Problem: Alteration in Blood Glucose  Goal: Glucose level within specified parameters  Outcome: Met  FS monitoring and Insulin gtt titration per Insulin computer calculator protocol. Pt NPO as ordered  And OGT to LCWS with bilious drain.    Problem: Bleeding, Risk of  Goal: Absence of active bleeding  Outcome: Met  CT output been 40 to 20 ml/hr, H/H been stable no active bleeding noted. Pt started on Heparin SQ as ordered.  Goal: Absence of impaired coagulation signs and symptoms  Outcome: Met  Goal: Knowledge of bleeding precautions  Outcome: Not Met  No health teachings done on pt at this time pt remains sedated and intubated.    Problem: Breathing Pattern - Ineffective  Goal: Respiratory rate, rhythm and depth return to baseline  Outcome: Not Met  Pt remains intubated and sedated, was able to wean down fiO2 to 40% no desaturation noted. Latest abg was text paged to [place] awaiting to call back for any vent change order otherwise pt been tolerating vent settings.  Goal: Arterial blood gas values and/or saturation return to baseline  Outcome: Met

## 2014-03-30 NOTE — Progress Notes (Signed)
Recommendations from DM team    -Start lantus 60 u BID, 1st dose tonight  -Continue insulin gtt and every one hour check  -Stop insulin gtt 2 hours after tomorrow's am lantus dose( around 11 am 03/31/14))  -Once start eating regular diet, should get 40 U of lispro qAC  -Modified insulin sliding scale after insulin gtt is off    150-175--> 6U  176-200--> 12 U  201-225--> 18 U  [phone]--> 24 U  251-275--> 30 U  276-300--> 36 U  > 300--> 42 U    Kenyon Eichelberger [employer] CorporationKhalil,MD PCCM fellow

## 2014-03-30 NOTE — Progress Notes (Addendum)
PCCM Fellow Progress Note     LOS: 2 days      ID  48 year old male patient with history DM, HTN, CKD who is in ICU after PTE on 03/29/14. Surgery without complications.     INTERVAL EVENTS   -Stable night  -PEEP increased to 10, FIO2 down to 40--> PO2 in the 70s. PEEP decreased to 5 early am.   -Was on dopamine 3 over night. Started on nipride early am for hypertension  -Lasix held, auto diuresing  -Heparin SQ started  -Neuro assessment overnight: follows commands  -Femoral a line removed  -Didn't use pacers    I/O: Neg 1.4 L, CT 370, Urine 3.4    Last HD: CVP 8, PAP 28/16/20, 7.8/3.2, PCWP 8, PVR 123, SVR 781    OBJECTIVE   Drips   dextrose-sodium chloride 5%-0.45% 10 mL/hr at 03/30/14 1000    DOPamine 2 mcg/kg/min (03/30/14 1000)    insulin regular infusion 12.5 Units/hr (03/30/14 1000)    nitroPRUSSide (NIPRIDE) infusion 1 mcg/kg/min (03/30/14 1005)    phenylephrine (NEO-SYNEPHRINE) infusion Stopped (03/30/14 0618)    propofol Stopped (03/30/14 0945)        Medications   ceFUROXime (ZINACEF) IVPB  1,500 mg Q12H NR    famotidine  20 mg Daily    Or    famotidine  20 mg Daily    heparin  5,000 Units Q8H       Vital Signs  Temp  Avg: 98.9 F (37.2 C)  Min: 96.4 F (35.8 C)  Max: 99.7 F (37.6 C)  Pulse  Avg: 71.8  Min: 62  Max: 90  BP  Min: 113/57  Max: 113/57  Arterial Line BP (ABP)  Min: 78/48  Max: 178/72  Resp  Avg: 16  Min: 8  Max: 22  SpO2  Avg: 98.2 %  Min: 92 %  Max: 100 %    CVP  CVP (mmHg): 13 mmHg    No Data Recorded     Vent Setting  Vent Mode: CPAP/PSV  Set  Rate  (BPM): 16     Set TV  (Control or Target) (ml): 700 mL  Set PEEP (cmH2O): 5 cmH20  FiO2 (%): 40 %    Last ABG  Lab Results   Component Value Date    ARTPH 7.47* 03/30/2014    ARTPO2 97 03/30/2014    ARTPCO2 31* 03/30/2014       24h I/O    Intake/Output Summary (Last 24 hours) at 03/30/14 1029  Last data filed at 03/30/14 1000   Gross per 24 hour   Intake 3053.15 ml   Output   4750 ml   Net -1696.85 ml     Date 03/30/14 0700 -  03/31/14 0659   Shift 0700-1459 1500-2259 2300-0659 24 Hour Total   I  N  T  A  K  E   I.V. 431.7   431.7    NG/GT 0   0    Shift Total 431.7   431.7   O  U  T  P  U  T   Urine 635   635    Emesis/NG Output 50   50    Chest Tube 90   90    Shift Total 775   775   Weight (kg) 111.2 111.2 111.2 111.2         Lines and tubes  Active PICC Line / CVC Line / PIV Line / Drain / Airway / Intraosseous Line /  Epidural Line / ART Line / Line Type / Wound     Name: Placement date: Placement time: Site: Days:    Introducer -  Right 03/29/14  1300    less than 1    Peripheral IV - 18 G Left Hand 03/29/14  0545  Hand  1    Chest Tube -  Anterior Mediastinal (x3) 03/29/14  1200  Mediastinal (x3)  less than 1    NG/OG Tube -  Center mouth 03/29/14  1500  Center mouth  less than 1    Indwelling Urinary Catheter -  03/29/14 0655 RN Latex free;Temperature probe 16 fr 10 ml Yes 03/29/14  0655  Latex free;Temperature probe  1    Non-Surgical Airway -  8 mm 03/29/14    8 mm  1    Arterial Line (ABP)  -  Right Radial     Radial      Incision -  03/29/14 1500 Sternum Anterior;Mid 03/29/14  1500   less than 1          Physical Exam  Gen: Intubated and sedated  HEENT: EOMI, PERRLA  CV: systolic murmur in bilateral chest  RESP: Scattered crackles bilaterally  CHEST: Chest tubes in place, no air leak evident  ABD: soft, NT/ND, NABS  EXT: No LE edema  NEURO: non-focal, grossly intact    Labs  Recent Labs      03/28/14   1838  03/29/14   1530  03/30/14   0230   NA  141  139  141   K  3.8  4.0  4.0   CL  103  101  104   BICARB  23  20*  21*   BUN  54*  55*  50*   CREAT  2.49*  2.48*  2.75*   [place]  9.8  8.8  9.4   MG   --   2.5  2.4   PHOS   --   2.4*  1.9*   TP  7.4   --   6.8   ALB  4.3   --   3.8   TBILI  0.21   --   0.50   DBILI  <0.2   --   <0.2   AST  26   --   167*   ALT  27   --   51*   ALK  40   --   28*       Recent Labs      03/28/14   1838  03/29/14   1530  03/29/14   2100  03/30/14   0355   WBC  7.3  14.4*  13.3*  15.1*   HGB  10.1*   8.9*  9.6*  9.8*   HCT  30.8*  26.6*  28.3*  28.9*   MCV  86.3  85.5  84.5  84.8   PLT  337  177  215  205   SEG  59  83*  91*  92*   LYMPHS  30  6*  5*  5*   MONOS  10   --   4*  2*   EOS  1   --    --    --        Recent Labs      03/28/14   1838  03/29/14   1530  03/30/14   0230   PTT  30.4  28.2  28.6   INR  1.0  1.4  1.3     Imaging  CXR - Lines and tube are in place    ASSESSMENT AND PLAN  Edwin Sandoval is a 48 year old male patient with history DM, HTN, CKD who is in ICU after PTE on 03/29/14. Good HD results after surgery. 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     1-CTEPH s/P PTE on 10/29, POD 1 with good HD results. Pre OP on Adempas and macitentan,  RHC PAP 53/19/30, CO/CI 6.7/2.7, PCWP 20, PVR 101.    2- Hypoxic respiratory failure post PTE. Minimal vent settings, borderline PO2 but pulse oximetry at 99%  3-AKI/CKD: baseline creatinine around 2.5. History of wilms tumor s/p nephrectomy  4-DM on insulin at home  5-OSA on 15 cm water CPAP at night  6-Hx of hypertension, HLP and hypothyroidism    Plan   -SBT and try to extubate  -Dopamine to keep CI more than 2.2, nitroprusside to keep MAP less than 85.   -May remove swan, a line, foley, pacer wires if extubated  -Continue cefuroxime while chest tube in  -Out of bed and ambulation if extubated today. 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    See fellow history and physical for further details of the patient's history.    Objective  NAD, waking up off sedation, intubated on Vent, RRR with  chest tube rubs and questionable M (most likely all sounds due to chest tubes), CTAB but coarse on Vent to anterior auscultation, NTND, obese, trace edema  I have examined the patient and concur with the fellow exam.    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  1. CTEPH:  - Hep SQ  - Almost off all pressors and inotropes: once off will get final hemodynamics and pull Swan  - SBT and extubate if he does well  - Once extubated: OOB, IS  - Possibly wires out today  2. Acute respiratory failure:  - ABG improving with much lower Vent settings  - Can extubate if passing SBT; may need HF NC support  3. DM:  - Ins gtt  - [place] BG  4. CKD from prior nephrectomy:  - Mild Cr bump  - Follow UO and labs  5. Hypothyroidism and HL:  - Resume Synthroid and statin once taking POs  6. OSA:  - Resume CPAP once extubated    I agree with the fellow care plan.  See the resident / fellow note for further details.    I spent 40 minutes providing critical care services.     Edwin Jewett, MD  Pulmonary and Critical Care  PID [id number] / Pager [phone]

## 2014-03-31 DIAGNOSIS — Z4682 Encounter for fitting and adjustment of non-vascular catheter: Secondary | ICD-10-CM

## 2014-03-31 LAB — GLUCOSE (POCT)
Glucose (POCT): 109 mg/dL (ref 70–115)
Glucose (POCT): 76 mg/dL (ref 70–115)
Glucose (POCT): 109 mg/dL (ref 70–115)
Glucose (POCT): 108 mg/dL (ref 70–115)
Glucose (POCT): 145 mg/dL — ABNORMAL HIGH (ref 70–115)
Glucose (POCT): 138 mg/dL — ABNORMAL HIGH (ref 70–115)
Glucose (POCT): 369 mg/dL — ABNORMAL HIGH (ref 70–115)
Glucose (POCT): 120 mg/dL — ABNORMAL HIGH (ref 70–115)
Glucose (POCT): 157 mg/dL — ABNORMAL HIGH (ref 70–115)
Glucose (POCT): 149 mg/dL — ABNORMAL HIGH (ref 70–115)

## 2014-03-31 LAB — BASIC METABOLIC PANEL, BLOOD
Anion Gap: 14 mmol/L (ref 7–15)
BUN: 41 mg/dL — ABNORMAL HIGH (ref 6–20)
Bicarbonate: 24 mmol/L (ref 22–29)
Calcium: 8.8 mg/dL (ref 8.5–10.6)
Chloride: 107 mmol/L (ref 98–107)
Creatinine: 2.3 mg/dL — ABNORMAL HIGH (ref 0.67–1.17)
GFR: 30 mL/min
Glucose: 109 mg/dL (ref 70–115)
Potassium: 4.3 mmol/L (ref 3.5–5.1)
Sodium: 145 mmol/L (ref 136–145)

## 2014-03-31 LAB — PHOSPHORUS, BLOOD: Phosphorous: 3.9 mg/dL (ref 2.7–4.5)

## 2014-03-31 LAB — ECG 12-LEAD
ATRIAL RATE: 70 {beats}/min
ATRIAL RATE: 69 {beats}/min
ECG INTERPRETATION: NORMAL
ECG INTERPRETATION: NORMAL
P AXIS: 56 degrees
P AXIS: 57 degrees
PR INTERVAL: 152 ms
PR INTERVAL: 148 ms
QRS INTERVAL/DURATION: 160 ms
QRS INTERVAL/DURATION: 138 ms
QT: 482 ms
QT: 490 ms
QTC INTERVAL: 520 ms
QTC INTERVAL: 525 ms
R AXIS: -64 degrees
R AXIS: -59 degrees
T AXIS: 84 degrees
T AXIS: 78 degrees
VENTRICULAR RATE: 70 {beats}/min
VENTRICULAR RATE: 69 {beats}/min

## 2014-03-31 LAB — CBC WITH DIFF, BLOOD
ANC-Automated: 13.2 10*3/uL — ABNORMAL HIGH (ref 1.6–7.0)
ANC-Manual Mode: 14.2 10*3/uL — ABNORMAL HIGH (ref 1.6–7.0)
Abs Lymphs: 0.6 10*3/uL — ABNORMAL LOW (ref 0.8–3.1)
Abs Lymphs: 1.4 10*3/uL (ref 0.8–3.1)
Abs Monos: 0.6 10*3/uL (ref 0.2–0.8)
Abs Monos: 1.3 10*3/uL — ABNORMAL HIGH (ref 0.2–0.8)
Bands: 9 % (ref 0–15)
Hct: 26.4 % — ABNORMAL LOW (ref 40.0–50.0)
Hct: 27.7 % — ABNORMAL LOW (ref 40.0–50.0)
Hgb: 8.6 gm/dL — ABNORMAL LOW (ref 13.7–17.5)
Hgb: 8.9 gm/dL — ABNORMAL LOW (ref 13.7–17.5)
Imm Gran Abs: 0.1 10*3/uL (ref <0.1)
Lymphocytes: 4 % — ABNORMAL LOW (ref 19–53)
Lymphocytes: 9 % — ABNORMAL LOW (ref 19–53)
MCH: 28 pg (ref 26.0–32.0)
MCH: 28.1 pg (ref 26.0–32.0)
MCHC: 32.6 % (ref 32.0–36.0)
MCHC: 32.1 % (ref 32.0–36.0)
MCV: 86 um3 (ref 79.0–95.0)
MCV: 87.4 um3 (ref 79.0–95.0)
MPV: 11.4 fL (ref 9.4–12.4)
MPV: 11.3 fL (ref 9.4–12.4)
Monocytes: 4 % — ABNORMAL LOW (ref 5–12)
Monocytes: 8 % (ref 5–12)
Myelocytes: 1 %
Number of Cells Counted: 119
Plt Count: 165 10*3/uL (ref 140–370)
Plt Count: 191 10*3/uL (ref 140–370)
Plt Est: NORMAL
RBC: 3.07 10*6/uL — ABNORMAL LOW (ref 4.60–6.10)
RBC: 3.17 10*6/uL — ABNORMAL LOW (ref 4.60–6.10)
RDW: 16.1 % — ABNORMAL HIGH (ref 12.0–14.0)
RDW: 16.2 % — ABNORMAL HIGH (ref 12.0–14.0)
Segs: 82 % — ABNORMAL HIGH (ref 34–71)
Segs: 83 % — ABNORMAL HIGH (ref 34–71)
WBC: 15.6 10*3/uL — ABNORMAL HIGH (ref 4.0–10.0)
WBC: 15.9 10*3/uL — ABNORMAL HIGH (ref 4.0–10.0)

## 2014-03-31 LAB — LIVER PANEL, BLOOD
ALT (SGPT): 41 U/L (ref 0–41)
AST (SGOT): 92 U/L — ABNORMAL HIGH (ref 0–40)
Albumin: 3.7 g/dL (ref 3.5–5.2)
Alkaline Phos: 28 U/L — ABNORMAL LOW (ref 40–129)
Bilirubin, Dir: <0.2 mg/dL (ref <0.2)
Bilirubin, Tot: 0.35 mg/dL (ref <1.20)
Total Protein: 6.5 g/dL (ref 6.0–8.0)

## 2014-03-31 LAB — PROTHROMBIN TIME, BLOOD
INR: 1.2
PT,Patient: 12.8 s — ABNORMAL HIGH (ref 9.7–12.5)

## 2014-03-31 LAB — MAGNESIUM, BLOOD: Magnesium: 2.5 mg/dL (ref 1.7–2.6)

## 2014-03-31 LAB — APTT, BLOOD: PTT: 23.9 s — ABNORMAL LOW (ref 25.0–34.0)

## 2014-03-31 MED ORDER — OXYCODONE HCL 10 MG OR TABS
10.0000 mg | ORAL_TABLET | ORAL | Status: DC | PRN
Start: 2014-03-31 — End: 2014-04-13
  Administered 2014-03-31 – 2014-04-05 (×4): 10 mg via ORAL
  Filled 2014-03-31 (×4): qty 1

## 2014-03-31 MED ORDER — DOCUSATE SODIUM 250 MG OR CAPS
250.0000 mg | ORAL_CAPSULE | Freq: Two times a day (BID) | ORAL | Status: DC
Start: 2014-03-31 — End: 2014-04-13
  Administered 2014-03-31 – 2014-04-13 (×25): 250 mg via ORAL
  Filled 2014-03-31 (×26): qty 1

## 2014-03-31 MED ORDER — SENNA 8.6 MG OR TABS
2.0000 | ORAL_TABLET | Freq: Every evening | ORAL | Status: DC
Start: 2014-03-31 — End: 2014-04-13
  Administered 2014-03-31 – 2014-04-12 (×13): 17.2 mg via ORAL
  Filled 2014-03-31 (×12): qty 2

## 2014-03-31 MED ORDER — INSULIN LISPRO (HUMAN) 100 UNIT/ML SC SOLN (CUSTOM)
0.0000 [IU] | Freq: Four times a day (QID) | INTRAMUSCULAR | Status: DC
Start: 2014-03-31 — End: 2014-04-03
  Administered 2014-03-31: 19:00:00 6 [IU] via SUBCUTANEOUS
  Administered 2014-04-01: 21:00:00 3 [IU] via SUBCUTANEOUS
  Administered 2014-04-02: 22:00:00 9 [IU] via SUBCUTANEOUS
  Filled 2014-03-31: qty 40
  Filled 2014-03-31: qty 9
  Filled 2014-03-31: qty 3
  Filled 2014-03-31 (×2): qty 40
  Filled 2014-03-31: qty 1

## 2014-03-31 MED ORDER — OMEGA-3-ACID ETHYL ESTERS 1 GM OR CAPS
2.0000 g | ORAL_CAPSULE | Freq: Two times a day (BID) | ORAL | Status: DC
Start: 2014-03-31 — End: 2014-04-13
  Administered 2014-03-31 – 2014-04-13 (×28): 2 g via ORAL
  Filled 2014-03-31 (×28): qty 2

## 2014-03-31 MED ORDER — FENOFIBRATE 145 MG OR TABS
145.0000 mg | ORAL_TABLET | Freq: Every day | ORAL | Status: DC
Start: 2014-03-31 — End: 2014-04-13
  Administered 2014-03-31 – 2014-04-13 (×14): 145 mg via ORAL
  Filled 2014-03-31 (×14): qty 1

## 2014-03-31 MED ORDER — MORPHINE SULFATE 2 MG/ML IJ SOLN
2.00 mg | Freq: Once | INTRAMUSCULAR | Status: AC
Start: 2014-03-31 — End: 2014-03-31
  Administered 2014-03-31: 2 mg via INTRAVENOUS

## 2014-03-31 MED ORDER — MORPHINE SULFATE 2 MG/ML IJ SOLN
2.0000 mg | INTRAMUSCULAR | Status: DC | PRN
Start: 2014-03-31 — End: 2014-04-13
  Administered 2014-03-31 – 2014-04-04 (×2): 2 mg via INTRAVENOUS
  Filled 2014-03-31 (×3): qty 1

## 2014-03-31 MED ORDER — SODIUM CHLORIDE 0.9 % IV SOLN
8.0000 mg | Freq: Three times a day (TID) | INTRAVENOUS | Status: DC | PRN
Start: 2014-03-31 — End: 2014-04-13
  Administered 2014-03-31: 8 mg via INTRAVENOUS
  Filled 2014-03-31 (×2): qty 4

## 2014-03-31 MED ORDER — OXYCODONE HCL 5 MG OR TABS
5.0000 mg | ORAL_TABLET | ORAL | Status: DC | PRN
Start: 2014-03-31 — End: 2014-03-31
  Administered 2014-03-31 (×3): 5 mg via ORAL
  Filled 2014-03-31 (×3): qty 1

## 2014-03-31 MED ORDER — OXYCODONE HCL 5 MG OR TABS
5.0000 mg | ORAL_TABLET | ORAL | Status: DC | PRN
Start: 2014-03-31 — End: 2014-04-13
  Administered 2014-04-02 – 2014-04-11 (×26): 5 mg via ORAL
  Filled 2014-03-31 (×27): qty 1

## 2014-03-31 MED ORDER — ROSUVASTATIN CALCIUM 10 MG OR TABS
20.0000 mg | ORAL_TABLET | Freq: Every evening | ORAL | Status: DC
Start: 2014-03-31 — End: 2014-04-13
  Administered 2014-03-31 – 2014-04-12 (×13): 20 mg via ORAL
  Filled 2014-03-31 (×13): qty 2

## 2014-03-31 MED ORDER — INSULIN LISPRO (HUMAN) 100 UNIT/ML SC SOLN (CUSTOM)
40.0000 [IU] | Freq: Three times a day (TID) | INTRAMUSCULAR | Status: DC
Start: 2014-03-31 — End: 2014-04-02
  Administered 2014-03-31 – 2014-04-01 (×5): 40 [IU] via SUBCUTANEOUS
  Filled 2014-03-31 (×3): qty 40

## 2014-03-31 MED ORDER — PRAZOSIN HCL 1 MG OR CAPS
1.0000 mg | ORAL_CAPSULE | Freq: Two times a day (BID) | ORAL | Status: DC
Start: 2014-03-31 — End: 2014-04-13
  Administered 2014-03-31 – 2014-04-13 (×27): 1 mg via ORAL
  Filled 2014-03-31 (×28): qty 1

## 2014-03-31 MED ORDER — SODIUM CHLORIDE 0.9 % IV SOLN
8.0000 mg | Freq: Three times a day (TID) | INTRAVENOUS | Status: DC
Start: 2014-03-31 — End: 2014-03-31

## 2014-03-31 MED ORDER — PROCHLORPERAZINE EDISYLATE 5 MG/ML IJ SOLN
10.0000 mg | Freq: Four times a day (QID) | INTRAMUSCULAR | Status: DC | PRN
Start: 2014-03-31 — End: 2014-04-04
  Administered 2014-03-31 (×2): 10 mg via INTRAVENOUS
  Filled 2014-03-31: qty 2

## 2014-03-31 MED ORDER — WARFARIN SODIUM 5 MG OR TABS
5.0000 mg | ORAL_TABLET | Freq: Every evening | ORAL | Status: DC
Start: 2014-03-31 — End: 2014-04-01
  Administered 2014-03-31: 5 mg via ORAL
  Filled 2014-03-31: qty 1

## 2014-03-31 NOTE — Progress Notes (Addendum)
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    Overnight: Did well after extubation, wires pulled, still with moderate chest tube output, controlled incisional pain, on supplemental O2. Ready for regular diet, requiring large amounts of insulin. Family at bedside. C/o some mild numbness of R index middle and thumb finger (C6-7 distribution) but is neurologically itnact otherwise.    ROS:  Mild SOB, no edema or abdominal distention  + incisional pain  All other systems were reviewed and are negative    Objective:  NAD, sitting in chair, asking questions.    Vital Signs:  Temperature:  [98.3 F (36.8 C)-98.7 F (37.1 C)] 98.3 F (36.8 C) (10/31 0800)  Blood pressure (BP): (121-163)/(57-84) 160/71 mmHg (10/31 1300)  Heart Rate:  [59-82] 70 (10/31 1300)  Respirations:  [10-27] 27 (10/31 1300)  Pain Score:  [-] 7 (10/31 1213)  O2 Device:  [-] Nasal cannula (10/31 1300)  O2 Flow Rate (L/min):  [2 l/min-6 l/min] 2 l/min (10/31 1300)  SpO2:  [96 %-100 %] 98 % (10/31 1300)    Wt Readings from Last 1 Encounters:   03/31/14 123 kg (271 lb 2.7 oz)       Intake/Output (Current Shift):  10/31 0600 - 10/31 1759  In: 935.3 [P.O.:750; I.V.:185.3]  Out: 760 [Urine:600]    -1284m last 24 hours  -3401msince admission    -310 ml from chest tubed (down from 370)    Current Facility-Administered Medications   Medication    acetaminophen (TYLENOL) solution 650 mg    ceFUROXime (ZINACEF) 1,500 mg in sodium chloride 0.9 % 50 mL IVPB    dextrose 50 % solution 12.5 g    dextrose-sodium chloride 5%-0.45% infusion    fenofibrate (TRICOR) tablet 145 mg    glucagon (GLUCAGON) injection 1 mg    glucose chewable tablet 16 g    glucose oral gel 1 Tube    heparin injection 5,000 Units    insulin glargine (LANTUS) injection 60 Units       insulin lispro (HUMALOG) injection 0-40 Units    insulin lispro (HUMALOG) injection 40 Units    levothyroxine (SYNTHROID) tablet 75 mcg    morphine injection 2 mg    nalOXone (NARCAN) injection 0.1 mg    omega-3 acid ethyl esters (LOVAZA) capsule 2 g    oxyCODONE (ROXICODONE) tablet 5 mg    prazosin (MINIPRESS) capsule 1 mg       Physical Exam:  Gen: NAD, conversing comfortably.  Eyes: EOMI, PERL, anicteric  HENT: Atraumatic, normocephalic, clear oropharynx  Neck: No JVD, trachea midline, no lymphadenopathy, supple  CV: RRR with rubs from chest tubes, No M/R/G/S3. R cordis in palce  Resp: some basilar crackles, no intercostal retractions/normal effort; sternotomy wound with Provena in place  Abdo: NTND, soft, no masses, obese.  Ext: Trace to no edema, no clubbing or cyanosis; no joint abnormalities  Neuro: No focal weakness or sensory deficit, CN grossly intact. Mild numbness of R hand index, middle fingers and thumb  Pysch: Appropriate affect and mood, good insight. AnOx3  Skin: Normal temperature, no rash or nodules      Laboratory data:   Lab Results   Component Value Date    NA 145 03/31/2014  K 4.3 03/31/2014    CL 107 03/31/2014    BICARB 24 03/31/2014    BUN 41* 03/31/2014    CREAT 2.30* 03/31/2014    GLU 109 03/31/2014    [place] 8.8 03/31/2014     Lab Results   Component Value Date    WBC 15.9* 03/31/2014    HGB 8.9* 03/31/2014    HCT 27.7* 03/31/2014    PLT 191 03/31/2014    SEG 83* 03/31/2014    BAND 9 03/31/2014    LYMPHS 9* 03/31/2014    MONOS 8 03/31/2014     Lab Results   Component Value Date    AST 92* 03/31/2014    ALT 41 03/31/2014    ALK 28* 03/31/2014    TBILI 0.35 03/31/2014    DBILI <0.2 03/31/2014    TP 6.5 03/31/2014    ALB 3.7 03/31/2014     Lab Results   Component Value Date    INR 1.2 03/31/2014    PTT 23.9* 03/31/2014     Lab Results   Component Value Date    ARTPH 7.47* 03/30/2014    ARTPO2 76 03/30/2014    ARTPCO2 31* 03/30/2014     CXR 03/31/14  - Extubated, chest tubes in  place, NGT removed, R IJ cordis, no mediastinal widening no new infiltrate    Assessment and Plan:  48 y/o M with 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  1. CTEPH:  - Hep SQ; start warfarin tonight  - Off all pressors and drips except insulin  - OOB, IS, PT/OT  - Chest tubes will likely come out in 1-2 days; continue prophy Abx  - Mild Hgb drop without further decrease on follow up CBC  - PLTs stable  2. Acute respiratory failure:  - Extubated/Resolved  3. DM:  - Ins gtt  - Advance diet to regular carb limited from full liquid  - Appreciate Endo recs: Started on Lantus and high dose AC Lispro and ISS  - Follow BG  4. CKD from prior nephrectomy:  - Cr improved (baseline is in the 2.5 range)  - Follow UO and labs  5. Hypothyroidism and HL:  - Resumed Synthroid and Choletesrol home meds (Lovaza, crestor, tricor instead of trilipix)  6. OSA:  - Resumed home CPAP once extubated  7. HTN:  - Resume prazosin  - Hold off on Coreg based on HR in the 60 range  8. Post-op pain:  - Controlled    I spent 35 minutes of time on the patient care unit reviewing the patient's chart, examining the patient, communicating with the patient's care providers and/or family members, reviewing diagnostic studies and providing counseling to the patient regarding pln of care.    Francesca Jewett, MD  Pulmonary and Critical Care  PID [id number] / Pager [phone]

## 2014-03-31 NOTE — Plan of Care (Signed)
Problem: Falls, Risk of  Goal: Keep patient free from falls utilizing universal fall precautions  Outcome: Met    Problem: Alteration in Blood Glucose  Goal: Glucose level within specified parameters  Outcome: Met    Problem: Pain - Acute  Goal: Communication of presence of pain  Outcome: Met    Problem: Bleeding, Risk of  Goal: Absence of active bleeding  Outcome: Met

## 2014-03-31 NOTE — Plan of Care (Signed)
Problem: Tissue Perfusion, Cardiopulmonary - Altered  Goal: Early recognition of deterioration  Monitor shows afib.  VS as noted.    Problem: Falls, Risk of  Goal: Keep patient free from falls utilizing universal fall precautions  Bed in the lowest and locked position.  Call bell within reach.  Side rails up 2/4.  Informed pt of unit routines.  Returns VU.    Problem: Alteration in Blood Glucose  Goal: Glucose level within specified parameters  Managing blood sugars per MAR.    Problem: Discharge Planning  Goal: Participation in care planning  Plan to DC back to [place] when medically stable.    Problem: Pain - Acute  Goal: Communication of presence of pain  See pain flowsheet.    Problem: Bleeding, Risk of  Goal: Absence of active bleeding  Chest tube output within expected range. Surgical dressings and line insertion sites clean and dry.    Problem: Breathing Pattern - Ineffective  Goal: Respiratory rate, rhythm and depth return to baseline  See respiratory flowsheet.    Problem: Skin Integrity- Intact patient high risk for impaired skin integrity Braden scale less than or equal to 18  Goal: Skin remains intact  On air mattress.  Turning q2 hours.  Skin intact.

## 2014-03-31 NOTE — Plan of Care (Signed)
Problem: Tissue Perfusion, Cardiopulmonary - Altered  Goal: Early recognition of deterioration  Outcome: Met  Hemodynamically stable throughout all activities today.  Some of pt's home hypertension meds restarted.    Problem: Falls, Risk of  Goal: Keep patient free from falls utilizing universal fall precautions  Outcome: Met  Bed low, brakes on, siderails up x 2, non-skid footwear on, call bell and bedside table within reach.  Patient educated regarding fall prevention measures, including calling for assistance with repositioning, reaching personal items, toileting and transfers.  Patient confirms understanding and agrees to request assistance as directed.            Problem: Alteration in Blood Glucose  Goal: Glucose level within specified parameters  Outcome: Met  Transitioned from insulin drip to lantus bid and sliding scale subQ insulin per recommendations from endocrine consult, as ordered.  Carb-limited diet.    Problem: Discharge Planning  Goal: Participation in care planning  Outcome: Met  Pt oriented to current status and plan of care; participative and cooperative with all interventions.  Questions encouraged and addressed.  Pt updated with status changes and corresponding changes to care plan.      Goal: Verbalizes/demonstrates knowledge of discharge instructions  Outcome: Not Met  Deferred; no instructions in place at this time.  Goal: Verbalize knowledge of prescribed medication management plan  Outcome: Not Met  Reviewed foods to avoid when on coumadin.  Ongoing education regarding current medication regimen.  Goal: Able to perform ADL- independently or with minimal assist  Outcome: Not Met  Increasingly independent with mobility and ADLs; minimal assist.    Problem: Pain - Acute  Goal: Communication of presence of pain  Outcome: Met  Quantifies pain and rates medication effectiveness using 0-10 scale.    Goal: Control of acute pain  Outcome: Met  Pt states he tolerates 2/10 but appears comfortable  and able to transfer, walk and do IS at 5/10.  Pain medicaiton Q4 hours.  Extra dose morphine given for breakthrough pain with increased activity after pt walked around ICU and from chair to bathroom.  Goal: Knowledge of pain management methods  Outcome: Met  Understands that pain cannot be safely medicated to 0-10; understands importance of early intervention and consistent control to allow progressive mobility and pulmonary toilet.    Problem: Bleeding, Risk of  Goal: Absence of active bleeding  Outcome: Met  No signs of active bleeding.  Goal: Absence of impaired coagulation signs and symptoms  Outcome: Met  Ptt checked today prior to coumadin start tonight.  On subQ heparin until therapeutic INR attained.  Goal: Knowledge of bleeding precautions  Outcome: Met    Problem: Breathing Pattern - Ineffective  Goal: Respiratory rate, rhythm and depth return to baseline  Outcome: Met  Respirations regular and quiet.  On home CPAP machine at night d/t hx OSA.    Problem: Skin Integrity- Intact patient high risk for impaired skin integrity Braden scale less than or equal to 18  Goal: Skin remains intact  Outcome: Met   Air mattress overlay for skin breakdown risk reduction. Skin assessed head to toe: skin WNL except for sternotomy incision.  Mepilex foam dressing in place over sacral spine; peeled back for skin assessment under dressing; skin intact and dressing re-applied.  Skin examined over bony prominences and under devices.  No evidence of pressure injury.  Patient OOB to chair as much as possible; advised to shift weight when in bed.  Goal: Knowledge of skin protection measures  Outcome:  Met  Mepilex removed from sacral spine; pt able to shift his own weight in response to pressure-related discomfort.    Problem: Skin Integrity Impaired: Secondary to pressure ulcer(admission or hospital acquired), cellulitis, incontinence, surgical wound/incision, chronic wound or skin disease  Goal: Absence of infection signs and  symptoms  Change when soiled, integrity impaired, physican order, or, no longer than 7 days.   Outcome: Met  Prevena wound vac in place over surgical incision.  No indicators of infection evident (pain, fever, rising WBC, hemodynamic instability).

## 2014-03-31 NOTE — Plan of Care (Signed)
Problem: Skin Integrity Impaired: Secondary to pressure ulcer(admission or hospital acquired), cellulitis, incontinence, surgical wound/incision, chronic wound or skin disease  Goal: Absence of infection signs and symptoms  Change when soiled, integrity impaired, physican order, or, no longer than 7 days.   Outcome: Met  Goal: Decrease in wound size: 10-15% weekly  Outcome: Met

## 2014-03-31 NOTE — Interdisciplinary (Signed)
Report to Luisa HartPatrick, night RN; reviewed hx, systems, events, plan.

## 2014-04-01 DIAGNOSIS — I4891 Unspecified atrial fibrillation: Secondary | ICD-10-CM

## 2014-04-01 DIAGNOSIS — R918 Other nonspecific abnormal finding of lung field: Secondary | ICD-10-CM

## 2014-04-01 LAB — COMPREHENSIVE METABOLIC PANEL, BLOOD
ALT (SGPT): 39 U/L (ref 0–41)
AST (SGOT): 46 U/L — ABNORMAL HIGH (ref 0–40)
Albumin: 3.5 g/dL (ref 3.5–5.2)
Alkaline Phos: 36 U/L — ABNORMAL LOW (ref 40–129)
Anion Gap: 14 mmol/L (ref 7–15)
BUN: 44 mg/dL — ABNORMAL HIGH (ref 6–20)
Bicarbonate: 25 mmol/L (ref 22–29)
Bilirubin, Tot: 0.3 mg/dL (ref <1.20)
Calcium: 8.7 mg/dL (ref 8.5–10.6)
Chloride: 100 mmol/L (ref 98–107)
Creatinine: 2.01 mg/dL — ABNORMAL HIGH (ref 0.67–1.17)
GFR: 35 mL/min
Glucose: 107 mg/dL (ref 70–115)
Potassium: 4.2 mmol/L (ref 3.5–5.1)
Sodium: 139 mmol/L (ref 136–145)
Total Protein: 6.5 g/dL (ref 6.0–8.0)

## 2014-04-01 LAB — HEMOGRAM, BLOOD
Hct: 28.6 % — ABNORMAL LOW (ref 40.0–50.0)
Hgb: 9.3 gm/dL — ABNORMAL LOW (ref 13.7–17.5)
MCH: 28.4 pg (ref 26.0–32.0)
MCHC: 32.5 % (ref 32.0–36.0)
MCV: 87.2 um3 (ref 79.0–95.0)
MPV: 11.3 fL (ref 9.4–12.4)
Plt Count: 209 10*3/uL (ref 140–370)
RBC: 3.28 10*6/uL — ABNORMAL LOW (ref 4.60–6.10)
RDW: 15.7 % — ABNORMAL HIGH (ref 12.0–14.0)
WBC: 10.2 10*3/uL — ABNORMAL HIGH (ref 4.0–10.0)

## 2014-04-01 LAB — GLUCOSE (POCT)
Glucose (POCT): 115 mg/dL (ref 70–115)
Glucose (POCT): 91 mg/dL (ref 70–115)
Glucose (POCT): 65 mg/dL — ABNORMAL LOW (ref 70–115)
Glucose (POCT): 67 mg/dL — ABNORMAL LOW (ref 70–115)
Glucose (POCT): 68 mg/dL — ABNORMAL LOW (ref 70–115)
Glucose (POCT): 114 mg/dL (ref 70–115)
Glucose (POCT): 168 mg/dL — ABNORMAL HIGH (ref 70–115)

## 2014-04-01 LAB — MAGNESIUM, BLOOD: Magnesium: 2.1 mg/dL (ref 1.7–2.6)

## 2014-04-01 LAB — APTT, BLOOD: PTT: 33.7 s (ref 25.0–34.0)

## 2014-04-01 LAB — PHOSPHORUS, BLOOD
Phosphorous: 2.7 mg/dL (ref 2.7–4.5)
Phosphorous: 3 mg/dL (ref 2.7–4.5)

## 2014-04-01 LAB — PROTHROMBIN TIME, BLOOD
INR: 1.2
PT,Patient: 12.3 s (ref 9.7–12.5)

## 2014-04-01 MED ORDER — HEPARIN SODIUM (PORCINE) 1000 UNIT/ML IJ SOLN
2600.00 [IU] | Freq: Once | INTRAMUSCULAR | Status: AC
Start: 2014-04-01 — End: 2014-04-01
  Administered 2014-04-01: 2600 [IU] via INTRAVENOUS
  Filled 2014-04-01: qty 2.6

## 2014-04-01 MED ORDER — CARVEDILOL 25 MG OR TABS
25.0000 mg | ORAL_TABLET | Freq: Two times a day (BID) | ORAL | Status: DC
Start: 2014-04-02 — End: 2014-04-05
  Administered 2014-04-02 – 2014-04-05 (×7): 25 mg via ORAL
  Filled 2014-04-01 (×2): qty 1
  Filled 2014-04-01: qty 2
  Filled 2014-04-01 (×4): qty 1

## 2014-04-01 MED ORDER — HYDRALAZINE HCL 25 MG OR TABS
25.0000 mg | ORAL_TABLET | Freq: Three times a day (TID) | ORAL | Status: DC | PRN
Start: 2014-04-01 — End: 2014-04-01
  Filled 2014-04-01: qty 1

## 2014-04-01 MED ORDER — HEPARIN (PORCINE) IN NACL 50-0.45 UNIT/ML-% IJ SOLN
INTRAMUSCULAR | Status: DC
Start: 2014-04-01 — End: 2014-04-01

## 2014-04-01 MED ORDER — CARVEDILOL 12.5 MG OR TABS
12.5000 mg | ORAL_TABLET | Freq: Two times a day (BID) | ORAL | Status: DC
Start: 2014-04-01 — End: 2014-04-01
  Administered 2014-04-01: 12.5 mg via ORAL
  Filled 2014-04-01: qty 1

## 2014-04-01 MED ORDER — RAMELTEON 8 MG OR TABS
8.0000 mg | ORAL_TABLET | Freq: Every evening | ORAL | Status: DC | PRN
Start: 2014-04-01 — End: 2014-04-13
  Administered 2014-04-01 – 2014-04-05 (×3): 8 mg via ORAL
  Filled 2014-04-01 (×2): qty 1

## 2014-04-01 MED ORDER — HEPARIN (PORCINE) IN NACL 50-0.45 UNIT/ML-% IJ SOLN
INTRAMUSCULAR | Status: DC
Start: 2014-04-01 — End: 2014-04-07
  Administered 2014-04-01: 1000 [IU]/h via INTRAVENOUS
  Administered 2014-04-02: 1840 [IU]/h via INTRAVENOUS
  Administered 2014-04-02: 1630 [IU]/h via INTRAVENOUS
  Administered 2014-04-02 (×2): 1420 [IU]/h via INTRAVENOUS
  Administered 2014-04-03 (×3): 1840 [IU]/h via INTRAVENOUS
  Administered 2014-04-04: 1945 [IU]/h via INTRAVENOUS
  Administered 2014-04-04: 1840 [IU]/h via INTRAVENOUS
  Administered 2014-04-05 – 2014-04-07 (×6): 2050 [IU]/h via INTRAVENOUS
  Filled 2014-04-01 (×12): qty 500

## 2014-04-01 MED ORDER — HYDRALAZINE HCL 25 MG OR TABS
25.0000 mg | ORAL_TABLET | Freq: Three times a day (TID) | ORAL | Status: DC | PRN
Start: 2014-04-01 — End: 2014-04-02
  Administered 2014-04-01: 25 mg via ORAL

## 2014-04-01 NOTE — Progress Notes (Signed)
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    Overnight: Did not have new Sx overnight, but did go into AFIB albeit rate controlled. Hemodynamically stable. Chest tubes still in as he has still a descent amount of output. Placed on Hep gtt and resumed 1/2 home Coreg dose. Never had AFIB before    ROS:  Mild SOB, no edema or abdominal distention  + incisional pain; no palpitations  All other systems were reviewed and are negative    Objective:  NAD, lying in bed, wife at bedside.    Vital Signs:  Temperature:  [98.3 F (36.8 C)-98.7 F (37.1 C)] 98.3 F (36.8 C) (11/01 1800)  Blood pressure (BP): (128-183)/(60-79) 153/60 mmHg (11/01 1600)  Heart Rate:  [80-101] 84 (11/01 1900)  Respirations:  [13-30] 19 (11/01 1900)  Pain Score:  [-] 3 (11/01 1900)  O2 Device:  [-] Nasal cannula (11/01 1900)  O2 Flow Rate (L/min):  [2 l/min-3 l/min] 2 l/min (11/01 1900)  SpO2:  [96 %-100 %] 99 % (11/01 1900)    Wt Readings from Last 1 Encounters:   04/01/14 122 kg (268 lb 15.4 oz)       Intake/Output (Current Shift):  11/01 1800 - 11/02 0559  In: 46.2 [I.V.:46.2]  Out: 30     -1184m yesterday  -45049msince admission    Current Facility-Administered Medications   Medication    acetaminophen (TYLENOL) solution 650 mg    [START ON 04/02/2014] carvedilol (COREG) tablet 25 mg    ceFUROXime (ZINACEF) 1,500 mg in sodium chloride 0.9 % 50 mL IVPB    dextrose 50 % solution 12.5 g    dextrose-sodium chloride 5%-0.45% infusion    docusate sodium (COLACE) capsule 250 mg    fenofibrate (TRICOR) tablet 145 mg    glucagon (GLUCAGON) injection 1 mg    glucose chewable tablet 16 g    glucose oral gel 1 Tube    heparin 25,000 units/500 mL infusion (50 units/mL) - PREMIX IN 1/2 NS    hydrALAZINE (APRESOLINE) tablet 25 mg    insulin  glargine (LANTUS) injection 60 Units    insulin lispro (HUMALOG) injection 0-40 Units    insulin lispro (HUMALOG) injection 40 Units    levothyroxine (SYNTHROID) tablet 75 mcg    morphine injection 2 mg    nalOXone (NARCAN) injection 0.1 mg    omega-3 acid ethyl esters (LOVAZA) capsule 2 g    ondansetron (ZOFRAN) 8 mg in sodium chloride 0.9 % 50 mL IVPB    oxyCODONE (ROXICODONE) tablet 10 mg    oxyCODONE (ROXICODONE) tablet 5 mg    prazosin (MINIPRESS) capsule 1 mg    prochlorperazine (COMPAZINE) injection 10 mg    rosuvastatin (CRESTOR) tablet 20 mg    senna (SENOKOT) tablet 17.2 mg         Physical Exam:  Gen: NAD, conversing comfortably.  Eyes: EOMI, PERL, anicteric  HENT: Atraumatic, normocephalic, clear oropharynx  Neck: No JVD, trachea midline, no lymphadenopathy, supple  CV: RRR with rubs from chest tubes, No M/R/G/S3. Chest tubes in place, cordis removed  Resp: improved basilar crackles, no intercostal retractions/normal effort; sternotomy wound with Provena in place  Abdo: NTND, soft, no masses, obese.  Ext: Trace to no edema, no  clubbing or cyanosis; no joint abnormalities  Neuro: No focal weakness or sensory deficit, CN grossly intact. Mild numbness of R hand index, middle fingers and thumb  Pysch: Appropriate affect and mood, good insight. AnOx3  Skin: Normal temperature, no rash or nodules    Laboratory data:   Lab Results   Component Value Date    NA 139 04/01/2014    K 4.2 04/01/2014    CL 100 04/01/2014    BICARB 25 04/01/2014    BUN 44* 04/01/2014    CREAT 2.01* 04/01/2014    GLU 107 04/01/2014    [place] 8.7 04/01/2014     Lab Results   Component Value Date    WBC 10.2* 04/01/2014    HGB 9.3* 04/01/2014    HCT 28.6* 04/01/2014    PLT 209 04/01/2014    SEG 83* 03/31/2014    BAND 9 03/31/2014    LYMPHS 9* 03/31/2014    MONOS 8 03/31/2014     Lab Results   Component Value Date    AST 46* 04/01/2014    ALT 39 04/01/2014    ALK 36* 04/01/2014    TBILI 0.30 04/01/2014    DBILI <0.2 03/31/2014     TP 6.5 04/01/2014    ALB 3.5 04/01/2014     Lab Results   Component Value Date    INR 1.2 04/01/2014    PTT 33.7 04/01/2014       CXR 04/01/14  No new infiltrate, Cordis removed, chest tubes in place, edema vs. 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  1. CTEPH:  - Hep gtt and warfarin: following CBC and INR  - OOB, IS, PT/OT  - Chest tubes will likely come out in 1-2 days; continue prophy Abx  - Stable Hgb post-op  - PLTs normal  2. AFIB:  - Rate controlled inately  - Started home Coreg at 1/2 dose; will go up to home dose if tolerates well  - Hep gtt started hep SQ stopped  - Warfarin started  3. DM:  - Changed to Lantus, ISS and pre-meal insulin per Endo recs: appreciate help  - Follow BG  4. CKD from prior nephrectomy:  - Cr better than baseline and pt still negative  - Follow UO and labs  - Holding Lasix at this time  5. Hypothyroidism and HL:  - Resumed Synthroid and Choletesrol home meds (Lovaza, crestor, tricor instead of trilipix)  6. OSA:  - Resumed home CPAP once extubated  7. HTN:  - Resumed prazosin  - Resumed Coreg (See above)  8. Post-op pain:  - Controlled    I spent 45 minutes of time on the patient care unit reviewing the patient's chart, examining the patient, communicating with the patient's care providers and/or family members, reviewing diagnostic studies and providing counseling to the patient regarding pln of care.    Francesca Jewett, MD  Pulmonary and Critical Care  PID [id number] / Pager [phone]

## 2014-04-01 NOTE — Interdisciplinary (Signed)
Dr. Gaylyn RongHa paged with  Notification of hypoglycemic episode most likely r/t increased activity this afternoon:   FSG 67 and repeat FSG after skim milk per protocol 68; also requested consideration of insulin regimen revision.  Requested call back; none received and no new orders.

## 2014-04-01 NOTE — Plan of Care (Signed)
Problem: Tissue Perfusion, Cardiopulmonary - Altered  Goal: Early recognition of deterioration  Outcome: Met  Started on heparin drip d/t afib, per Dr. Cristal Deer, with PTT due _0 .   CBC and chem checked at 1330 and reviewed by MD; within acceptable limits.  No instability this shift.    Problem: Falls, Risk of  Goal: Keep patient free from falls utilizing universal fall precautions  Outcome: Met  Bed low, brakes on, siderails up x 2, non-skid footwear on, call bell and bedside table within reach.  Patient educated regarding fall prevention measures, including calling for assistance with repositioning, reaching personal items, toileting and transfers.  Patient confirms understanding and agrees to request assistance as directed. Stable when walking; good balance, coordination and strength.           Problem: Alteration in Blood Glucose  Goal: Glucose level within specified parameters  Outcome: Met  FSG well controlled on current insulin regimen.    Problem: Discharge Planning  Goal: Participation in care planning  Outcome: Met  Pt oriented to current status and plan of care; participative and cooperative with all interventions.  Questions encouraged and addressed.  Pt updated with status changes and corresponding changes to care plan.      Goal: Verbalizes/demonstrates knowledge of discharge instructions  Outcome: Not Met  Deferred; no discharge instructions at this time.  Goal: Verbalize knowledge of prescribed medication management plan  Outcome: Not Met  Education regarding current inpatient medication regimen ongoing; discharge medication plan deferred until finalized.  Warfarin teaching ongoing.  Goal: Able to perform ADL- independently or with minimal assist  Outcome: Met  Independent ADLs except for toileting (BMs) when pt needs assistance managing monitoring lines, IV pump and chest tubes/drain to avoid tripping/falling.    Problem: Pain - Acute  Goal: Communication of presence of pain  Outcome:  Met  Uses 0-10 scale to quantify pain and rate effectiveness of pain management interventions.  Goal: Control of acute pain  Outcome: Met  No pain medication since last night at 2000.  Pt able to do IS with good effort and mobilize (transfer between bed and chair, walk, toilet) without impairment from pain.  Goal: Knowledge of pain management methods  Outcome: Met  Familiar with current pain management regimen; declines medication.    Problem: Bleeding, Risk of  Goal: Absence of active bleeding  Outcome: Met  Hgb/Hct increase on last CBC at 1330.  Monitoring CT and VS for signs of active bleeding since heparin drip started at 1030.  Goal: Absence of impaired coagulation signs and symptoms  Outcome: Met  Heparin drip to be titrated to goal PTT per physician order.  Goal: Knowledge of bleeding precautions  Outcome: Met  Hx warfarin use; familiar with bleeding precautions.  Reviewed signs to watch now that heparin drip initiated.    Problem: Breathing Pattern - Ineffective  Goal: Respiratory rate, rhythm and depth return to baseline  Outcome: Met  Incentive spirometry volumes and technique improved since pain abated.  Pt proactive with IS, taking at least 10 IS breaths per hour without coaching or reminders.    Problem: Skin Integrity- Intact patient high risk for impaired skin integrity Braden scale less than or equal to 18  Goal: Skin remains intact  Outcome: Met  Normal sensation and motor ability; able to shift position as needed to alleviate pressure-related discomfort.  Goal: Knowledge of skin protection measures  Outcome: Met  Educated regarding position shifts in bed or chair to avoid pressure-related skin breakdown.

## 2014-04-01 NOTE — Interdisciplinary (Signed)
Dr. Gaylyn RongHa paged again with request for call back.  Spoke with MD at 1830 to confirm no revision to insulin regimen; subsequently, scheduled dose of 40 units lispro given as ordered after pt ate 100% of dinner.

## 2014-04-01 NOTE — Interdisciplinary (Signed)
Report to Rachele, night RN; reviewed history, systems, events, plan.

## 2014-04-01 NOTE — Interdisciplinary (Addendum)
MD paged 03/31/14 @ 1740 regarding new onset afib w/rate to 102 bpm, as well as need for revised pain medication regimen.

## 2014-04-02 DIAGNOSIS — J811 Chronic pulmonary edema: Secondary | ICD-10-CM

## 2014-04-02 DIAGNOSIS — E118 Type 2 diabetes mellitus with unspecified complications: Secondary | ICD-10-CM

## 2014-04-02 DIAGNOSIS — E1165 Type 2 diabetes mellitus with hyperglycemia: Secondary | ICD-10-CM

## 2014-04-02 DIAGNOSIS — E1142 Type 2 diabetes mellitus with diabetic polyneuropathy: Secondary | ICD-10-CM

## 2014-04-02 DIAGNOSIS — N183 Chronic kidney disease, stage 3 unspecified (CMS-HCC): Secondary | ICD-10-CM | POA: Diagnosis present

## 2014-04-02 DIAGNOSIS — E1121 Type 2 diabetes mellitus with diabetic nephropathy: Secondary | ICD-10-CM

## 2014-04-02 DIAGNOSIS — IMO0002 Reserved for concepts with insufficient information to code with codable children: Secondary | ICD-10-CM | POA: Diagnosis present

## 2014-04-02 LAB — COMPREHENSIVE METABOLIC PANEL, BLOOD
ALT (SGPT): 32 U/L (ref 0–41)
AST (SGOT): 35 U/L (ref 0–40)
Albumin: 2.9 g/dL — ABNORMAL LOW (ref 3.5–5.2)
Alkaline Phos: 29 U/L — ABNORMAL LOW (ref 40–129)
Anion Gap: 12 mmol/L (ref 7–15)
BUN: 45 mg/dL — ABNORMAL HIGH (ref 6–20)
Bicarbonate: 25 mmol/L (ref 22–29)
Bilirubin, Tot: 0.24 mg/dL (ref <1.20)
Calcium: 8.2 mg/dL — ABNORMAL LOW (ref 8.5–10.6)
Chloride: 103 mmol/L (ref 98–107)
Creatinine: 1.82 mg/dL — ABNORMAL HIGH (ref 0.67–1.17)
GFR: 40 mL/min
Glucose: 53 mg/dL — CL (ref 70–115)
Potassium: 4 mmol/L (ref 3.5–5.1)
Sodium: 140 mmol/L (ref 136–145)
Total Protein: 5.8 g/dL — ABNORMAL LOW (ref 6.0–8.0)

## 2014-04-02 LAB — GLUCOSE (POCT)
Glucose (POCT): 111 mg/dL (ref 70–115)
Glucose (POCT): 54 mg/dL — CL (ref 70–115)
Glucose (POCT): 63 mg/dL — ABNORMAL LOW (ref 70–115)
Glucose (POCT): 78 mg/dL (ref 70–115)
Glucose (POCT): 114 mg/dL (ref 70–115)
Glucose (POCT): 137 mg/dL — ABNORMAL HIGH (ref 70–115)
Glucose (POCT): 137 mg/dL — ABNORMAL HIGH (ref 70–115)
Glucose (POCT): 206 mg/dL — ABNORMAL HIGH (ref 70–115)

## 2014-04-02 LAB — APTT, BLOOD
PTT: 36.3 s — ABNORMAL HIGH (ref 25.0–34.0)
PTT: 35.9 s — ABNORMAL HIGH (ref 25.0–34.0)
PTT: 55 s — ABNORMAL HIGH (ref 25.0–34.0)

## 2014-04-02 LAB — PROTHROMBIN TIME, BLOOD
INR: 1.2
INR: 1.1
PT,Patient: 12.8 s — ABNORMAL HIGH (ref 9.7–12.5)
PT,Patient: 12.1 s (ref 9.7–12.5)

## 2014-04-02 LAB — HEMOGRAM, BLOOD
Hct: 26 % — ABNORMAL LOW (ref 40.0–50.0)
Hgb: 8.5 gm/dL — ABNORMAL LOW (ref 13.7–17.5)
MCH: 28.3 pg (ref 26.0–32.0)
MCHC: 32.7 % (ref 32.0–36.0)
MCV: 86.7 um3 (ref 79.0–95.0)
MPV: 11 fL (ref 9.4–12.4)
Plt Count: 205 10*3/uL (ref 140–370)
RBC: 3 10*6/uL — ABNORMAL LOW (ref 4.60–6.10)
RDW: 15.5 % — ABNORMAL HIGH (ref 12.0–14.0)
WBC: 8.7 10*3/uL (ref 4.0–10.0)

## 2014-04-02 LAB — PHOSPHORUS, BLOOD
Phosphorous: 3.3 mg/dL (ref 2.7–4.5)
Phosphorous: 2.9 mg/dL (ref 2.7–4.5)

## 2014-04-02 LAB — MAGNESIUM, BLOOD: Magnesium: 2.1 mg/dL (ref 1.7–2.6)

## 2014-04-02 MED ORDER — INSULIN LISPRO (HUMAN) 100 UNIT/ML SC SOLN (CUSTOM)
25.0000 [IU] | Freq: Three times a day (TID) | INTRAMUSCULAR | Status: DC
Start: 2014-04-02 — End: 2014-04-02

## 2014-04-02 MED ORDER — HEPARIN SODIUM (PORCINE) 1000 UNIT/ML IJ SOLN (CUSTOM)
2600.0000 [IU] | Freq: Once | INTRAMUSCULAR | Status: AC
Start: 2014-04-02 — End: 2014-04-02
  Administered 2014-04-02 (×2): 2600 [IU] via INTRAVENOUS
  Filled 2014-04-02: qty 2.6

## 2014-04-02 MED ORDER — HEPARIN SODIUM (PORCINE) 1000 UNIT/ML IJ SOLN
2600.00 [IU] | Freq: Once | INTRAMUSCULAR | Status: AC
Start: 2014-04-02 — End: 2014-04-02
  Administered 2014-04-02: 2600 [IU] via INTRAVENOUS
  Filled 2014-04-02: qty 2.6

## 2014-04-02 MED ORDER — WARFARIN SODIUM 3 MG OR TABS
3.0000 mg | ORAL_TABLET | Freq: Every evening | ORAL | Status: DC
Start: 2014-04-02 — End: 2014-04-03
  Administered 2014-04-02: 3 mg via ORAL
  Filled 2014-04-02: qty 1

## 2014-04-02 MED ORDER — INSULIN LISPRO (HUMAN) 100 UNIT/ML SC SOLN (CUSTOM)
25.0000 [IU] | Freq: Three times a day (TID) | INTRAMUSCULAR | Status: DC
Start: 2014-04-02 — End: 2014-04-05
  Administered 2014-04-02: 13:00:00 12 [IU] via SUBCUTANEOUS
  Administered 2014-04-02 – 2014-04-05 (×9): 25 [IU] via SUBCUTANEOUS
  Filled 2014-04-02 (×9): qty 25

## 2014-04-02 MED ORDER — INSULIN GLARGINE 100 UNIT/ML SC SOLN
40.0000 [IU] | Freq: Two times a day (BID) | SUBCUTANEOUS | Status: DC
Start: 2014-04-02 — End: 2014-04-04
  Administered 2014-04-02 – 2014-04-04 (×5): 40 [IU] via SUBCUTANEOUS
  Filled 2014-04-02 (×4): qty 40

## 2014-04-02 NOTE — Plan of Care (Signed)
Pt transferred from ICU, arrived via w/c, transferred to chair independently. Pt denies pain or distress, on 3L O2, sats 99%, family at bedside. Will continue with POC

## 2014-04-02 NOTE — Interdisciplinary (Signed)
04/02/14 1256   Patient Information   Why is Patient in the Hospital? 48 y.o male admitted for PTE   Prior to Level of Function Ambulatory/Independent with ADL's   Income Information   Income Source Unknown   Income/Expense Information Income meets expenses   Referral To   Financial Resources Other (Comment)  Administrator([employer])   Discharge Planning   Living Arrangements Spouse / significant other  Maxine Glenn([place] [phone])   Type of Residence Private residence  (Single level)   Home Care Services No   Patient expects to be discharged to: home   Do you have transportation home?  Yes  (Fly home with wife)   Spoke with pt and wife at bedside. [place] with his wife in a single story residence. I-ADLs. Home O2 thru [facility]. Discharge plan is to fly home upon discharge. Pharmacy is [facility].  Sumner BoastKarla Mishawn Hemann  LVN  Transition Coordinator

## 2014-04-02 NOTE — Interdisciplinary (Signed)
Patient requested for glucose check. Stated he felt like his sugar is low. POC testing done. Glucose is 63. [place] juice given x2, will recheck blood glucose level.

## 2014-04-02 NOTE — Plan of Care (Signed)
Problem: Tissue Perfusion, Cardiopulmonary - Altered  Goal: Early recognition of deterioration  Outcome: Met  Blood pressure 137/62, pulse 68, temperature 98.2 °F (36.8 °C), resp. rate 16, height 5' 11" (1.803 m), weight 122.6 kg (270 lb 4.5 oz), SpO2 99 %.  Tele shows SR without ectopy.  Denies complaints of SOB, pain, palpitations.  Tolerating PO, voiding freely.  Currently, sitting up in chair in no acute distress.  Family at BS.  No vasoactive gtts at this time.  Tele level of care.  Bld. glucose at breakfast and lunch, 114 and 137 respectively. Pt. refused Lispro this am and took only half of Lispro dose this afternoon.  Lantus 40units given this am. Will continue to monitor for acute changes in patient status.       Problem: Falls, Risk of  Goal: Keep patient free from falls utilizing universal fall precautions  Outcome: Met  Call light placed within reach.  Instructed patient to call for assistance when needed.  Patient verbalized understanding.  SR up x 2 with bed in low locked position.  Non-skid footwear placed.  Will perform hourly rounding to ensure patient safety.      Problem: Alteration in Blood Glucose  Goal: Glucose level within specified parameters  Outcome: Not Met  Bld. glucose WNL. No hypoglycemic episodes noted this morning. Will continue to monitor for acute changes in patient status. No sliding scale coverage given at this time. Will continue to monitor blood glucose AC and HS and PRN.  Will provide SSI as needed.      Problem: Discharge Planning  Goal: Participation in care planning  Outcome: Not Met  Discharge date unknown at this time.  Case management to follow.      Problem: Pain - Acute  Goal: Communication of presence of pain  Outcome: Met  Patient denies pain at this time.  Will continue to assess pain level q1hr and medicate PRN.    Problem: Bleeding, Risk of  Goal: Absence of active bleeding  Outcome: Met  VSS, afebrile.  Tele shows SR without ectopy. No s/s of active bleeding  noted.  Will continue to monitor for bleeding.  Will continue to monitor CBCs.     Lab Results   Component Value Date/Time     HGB 8.5* 04/02/2014 02:30 AM     HGB 9.3* 04/01/2014 01:30 PM     HCT 26.0* 04/02/2014 02:30 AM     HCT 28.6* 04/01/2014 01:30 PM           Problem: Breathing Pattern - Ineffective  Goal: Respiratory rate, rhythm and depth return to baseline  Outcome: Met  Patient denies SOB at this time.  O2 sats on 3L/NC=98%.  Respirations appear even and unlabored. Encouraged hourly IS use. Will continue to monitor for acute changes in respiratory status.        Problem: Skin Integrity- Intact patient high risk for impaired skin integrity Braden scale less than or equal to 18  Goal: Skin remains intact  Outcome: Met  Patient without skin breakdown noted at this time.  Currently, on air mattress.  Mepilex to sacrum with skin underneath intact. Will continue frequent turning and repositioning.  Will continue to assess skin integrity and document as needed.

## 2014-04-02 NOTE — Progress Notes (Addendum)
Daily Progress Note:  04/02/2014     Current Hospital Stay:   5 days - Admitted on: 03/28/2014    Subjective:  Sitting up in chair eating breakfast in no acute distress. Wife at bedside.    Objective:  TMAX 98.6, NSR, O2 3L n/c sats 100%, heparin drip infusing    Vital Signs:  Temperature:  [98.3 F (36.8 C)-98.6 F (37 C)] 98.3 F (36.8 C) (11/02 0300)  Blood pressure (BP): (110-153)/(52-66) 126/56 mmHg (11/02 0600)  Heart Rate:  [64-101] 75 (11/02 0700)  Respirations:  [15-27] 18 (11/02 0700)  Pain Score:  [-] 6 (11/02 0834)  O2 Device:  [-] Nasal cannula (11/02 0700)  O2 Flow Rate (L/min):  [2 l/min-3 l/min] 3 l/min (11/02 0700)  SpO2:  [93 %-100 %] 100 % (11/02 0700)    Wt Readings from Last 1 Encounters:   04/02/14 122.6 kg (270 lb 4.5 oz)       Intake/Output (Current Shift):  11/02 0600 - 11/02 1759  In: 56.8 [I.V.:56.8]  Out: 0   CT 260ml    Physical Exam:  General Appearance: skin warm, dry, and pink, cooperative.  Eyes:  conjunctivae and corneas clear. PERRL, EOM's intact. Fundi benign.  Ears:  normal TMs and canal.  Nose:  normal.  Mouth: normal.  Neck:  Neck supple. No adenopathy, thyroid symmetric, normal size.  Heart:  normal rate and regular rhythm, no murmurs, clicks, or gallops.  Lungs: clear to auscultation and percussion, no chest deformities noted.  Breast:  negative.  Abdomen: Abdomen soft, non-tender. No masses or organomegaly. Bowel sounds normal, +BM.  Extremities:  no cyanosis, clubbing, or edema.  Vascular:  Radial: R: +2     L:+2  Dorsal Ped: R: +2     L:+2  Post Tib: R: +2     L:+2  Skin:  Prevena dressing to sternum CDI with good suction and seal.  Neuro: Gait normal. Reflexes normal and symmetric. Sensation and strength grossly normal.  Mental Status: Appearance/Cooperation: in no apparent distress  Musculoskeletal: MAE    Laboratory data:   Lab Results   Component Value Date    NA 140 04/02/2014    K 4.0 04/02/2014    CL 103 04/02/2014    BICARB 25 04/02/2014    BUN 45* 04/02/2014    CREAT 1.82* 04/02/2014    GLU 53* 04/02/2014    [place] 8.2* 04/02/2014     Lab Results   Component Value Date    WBC 8.7 04/02/2014    HGB 8.5* 04/02/2014    HCT 26.0* 04/02/2014    PLT 205 04/02/2014     Lab Results   Component Value Date    INR 1.2 04/02/2014    PTT 36.3* 04/02/2014         Current Facility-Administered Medications   Medication Dose Route Frequency Provider Last Rate Last Dose    acetaminophen (TYLENOL) solution 650 mg  650 mg NG Tube Q4H PRN Samella ParrKo, Yan, NP        carvedilol (COREG) tablet 25 mg  25 mg Oral BID WC Papamatheakis, Demosthenes Gabriel, MD   25 mg at 04/02/14 0834    ceFUROXime (ZINACEF) 1,500 mg in sodium chloride 0.9 % 50 mL IVPB  1,500 mg IntraVENOUS Q12H Danna HeftyNR Ko, Yan, NP   1,500 mg at 04/02/14 0319    dextrose 50 % solution 12.5 g  12.5 g IntraVENOUS PRN Peyer, Saunders GlanceMichael K, PHARMD        dextrose-sodium chloride 5%-0.45% infusion  IntraVENOUS Continuous Samella ParrKo, Yan, NP   Stopped at 03/31/14 1200    docusate sodium (COLACE) capsule 250 mg  250 mg Oral BID Roma SchanzHa, Duc Minh, MD   250 mg at 04/02/14 [id number]    fenofibrate (TRICOR) tablet 145 mg  145 mg Oral Daily Papamatheakis, Veleta Minersemosthenes Gabriel, MD   145 mg at 04/02/14 0834    glucagon (GLUCAGON) injection 1 mg  1 mg IntraMUSCULAR Once PRN Peyer, Saunders GlanceMichael K, PHARMD        glucose chewable tablet 16 g  4 tablet Oral PRN Peyer, Saunders GlanceMichael K, PHARMD        glucose oral gel 1 Tube  1 Tube Oral PRN Peyer, Saunders GlanceMichael K, PHARMD        heparin 25,000 units/500 mL infusion (50 units/mL) - PREMIX IN 1/2 NS   IntraVENOUS Continuous Papamatheakis, Veleta Minersemosthenes Gabriel, MD 28.4 mL/hr at 04/02/14 0646 1,420 Units/hr at 04/02/14 0646    insulin glargine (LANTUS) injection 40 Units  40 Units Subcutaneous Q12H Khalil, Anas Molowirahmat, MD        insulin lispro (HUMALOG) injection 0-40 Units  0-40 Units Subcutaneous 4x Daily WC Papamatheakis, Veleta Minersemosthenes Gabriel, MD   3 Units at 04/01/14 2055    insulin lispro (HUMALOG) injection 25 Units  25 Units Subcutaneous  TID WC Khalil, Anas Molowirahmat, MD        levothyroxine (SYNTHROID) tablet 75 mcg  75 mcg Oral QAM AC Khalil, Anas Molowirahmat, MD   75 mcg at 04/02/14 [id number]    morphine injection 2 mg  2 mg IntraVENOUS Q4H PRN Roma SchanzHa, Duc Minh, MD   2 mg at 03/31/14 0830    [facility](NARCAN) injection 0.1 mg  0.1 mg IntraVENOUS Q2 Min PRN Galasso, Myrtha MantisJanine Ann, PHARMD        omega-3 acid ethyl esters (LOVAZA) capsule 2 g  2 g Oral BID Papamatheakis, Demosthenes Gabriel, MD   2 g at 04/02/14 0834    ondansetron (ZOFRAN) 8 mg in sodium chloride 0.9 % 50 mL IVPB  8 mg IntraVENOUS Q8H PRN Zhian, Samaneh, PHARMD   8 mg at 03/31/14 2035    oxyCODONE (ROXICODONE) tablet 10 mg  10 mg Oral Q4H PRN Roma SchanzHa, Duc Minh, MD   10 mg at 03/31/14 2000    oxyCODONE (ROXICODONE) tablet 5 mg  5 mg Oral Q4H PRN Roma SchanzHa, Duc Minh, MD   5 mg at 04/02/14 [id number]    prazosin (MINIPRESS) capsule 1 mg  1 mg Oral Q12H Papamatheakis, Demosthenes Gabriel, MD   1 mg at 04/02/14 [id number]    prochlorperazine (COMPAZINE) injection 10 mg  10 mg IntraVENOUS Q6H PRN Katheren ShamsScholten, Eric Bradley Dutton, MD   10 mg at 03/31/14 2310    ramelteon (ROZEREM) tablet 8 mg  8 mg Oral Nightly PRN Eustace PenBerngard, Samuel Clark, MD   8 mg at 04/01/14 2228    rosuvastatin (CRESTOR) tablet 20 mg  20 mg Oral QPM Papamatheakis, Demosthenes Gabriel, MD   20 mg at 04/01/14 1744    senna (SENOKOT) tablet 17.2 mg  2 tablet Oral HS Roma SchanzHa, Duc Minh, MD   17.2 mg at 03/31/14 1358       Assessment and Plan:  POD #4 s/p Bilateral pulmonary thromboendarterectomy  -AC with IV heparin, PTT 36.3, INR 1.2  -Leukocytosis: Resolved, WBCs 8.7 today   -Hypothyroid: on synthroid  -Keep CTs  -Awaitng tele bed  -Cont care per PTE service      As above. Doing well. Continue current care as per PTE Service.

## 2014-04-02 NOTE — Progress Notes (Addendum)
PCCM Fellow Progress Note     LOS: 5 days      ID  48 year old male patient with history DM, HTN, CKD who is in ICU after PTE on 03/29/14. Surgery without complications.     INTERVAL EVENTS   -Converted to sinus rhythm around 2 am, started on coreg yesterday( home dose)  -6/10 pain around the surgical incision  -Was up and walking yesterday  -No fevers or chills.   -Started on heparin gtt, PTT around last ptt 36  -2 Low symptomatic blood sugars over the last 24 hours( 65 and 54), given multiple snacks to keep his blood sugar high  -Used home CPAP last night     I/O: pos1 332 cc, CT 260, Urine 2.2      OBJECTIVE   Drips   dextrose-sodium chloride 5%-0.45% Stopped (03/31/14 1200)    heparin 25,000 units/500 mL 1,420 Units/hr (04/02/14 0646)        Medications   carvedilol  25 mg BID WC    ceFUROXime (ZINACEF) IVPB  1,500 mg Q12H NR    docusate sodium  250 mg BID    fenofibrate  145 mg Daily    insulin glargine  60 Units Q12H    insulin lispro  0-40 Units 4x Daily WC    insulin lispro  40 Units TID WC    levothyroxine  75 mcg QAM AC    omega-3 acid ethyl esters  2 g BID    prazosin  1 mg Q12H    rosuvastatin  20 mg QPM    senna  2 tablet HS       Vital Signs  Temp  Avg: 98.4 F (36.9 C)  Min: 98.3 F (36.8 C)  Max: 98.6 F (37 C)  Pulse  Avg: 82.9  Min: 64  Max: 101  BP  Min: 110/59  Max: 153/60  No Data Recorded  Resp  Avg: 19.6  Min: 15  Max: 27  SpO2  Avg: 97.7 %  Min: 93 %  Max: 100 %      Last ABG  No results found for: ARTPH, ARTPO2, ARTPCO2    24h I/O    Intake/Output Summary (Last 24 hours) at 03/30/14 1029  Last data filed at 03/30/14 1000   Gross per 24 hour   Intake 3053.15 ml   Output   4750 ml   Net -1696.85 ml     Date 03/30/14 0700 - 03/31/14 0659   Shift 0700-1459 1500-2259 2300-0659 24 Hour Total   I  N  T  A  K  E   I.V. 431.7   431.7    NG/GT 0   0    Shift Total 431.7   431.7   O  U  T  P  U  T   Urine 635   635    Emesis/NG Output 50   50    Chest Tube 90   90    Shift Total 775    775   Weight (kg) 111.2 111.2 111.2 111.2         Lines and tubes  Active PICC Line / CVC Line / PIV Line / Drain / Airway / Intraosseous Line / Epidural Line / ART Line / Line Type / Wound     Name: Placement date: Placement time: Site: Days:    Peripheral IV - 18 G Left Hand 03/29/14  0545  Hand  4    Peripheral IV - 20 G  Right Hand 04/01/14  0001  Hand  1    Chest Tube -  Anterior Mediastinal (x3) 03/29/14  1200  Mediastinal (x3)  3    Incision -  03/29/14 1500 Sternum Anterior;Mid 03/29/14  1500   3          Physical Exam  Gen: NAD, conversing comfortably.  Eyes: EOMI, PERL, anicteric  HENT: Atraumatic, normocephalic, clear oropharynx  Neck: No JVD, trachea midline, no lymphadenopathy, supple  CV: RRR with rubs from chest tubes, No M/R/G/S3. Chest tubes in place, cordis removed  Resp: improved basilar crackles, no intercostal retractions/normal effort; sternotomy wound with Provena in place  Abdo: NTND, soft, no masses, obese.  Ext: Trace to no edema, no clubbing or cyanosis; no joint abnormalities  Neuro: No focal weakness or sensory deficit, CN grossly intact. Mild numbness of R hand index, middle fingers and thumb  Pysch: Appropriate affect and mood, good insight. AnOx3  Skin: Normal temperature, no rash or nodules    Labs  Recent Labs      03/31/14   0245  04/01/14   0101  04/01/14   1330  04/02/14   0230   NA  145   --   139  140   K  4.3   --   4.2  4.0   CL  107   --   100  103   BICARB  24   --   25  25   BUN  41*   --   44*  45*   CREAT  2.30*   --   2.01*  1.82*   [place]  8.8   --   8.7  8.2*   MG  2.5   --   2.1  2.1   PHOS  3.9  2.7  3.0  3.3   TP  6.5   --   6.5  5.8*   ALB  3.7   --   3.5  2.9*   TBILI  0.35   --   0.30  0.24   DBILI  <0.2   --    --    --    AST  92*   --   46*  35   ALT  41   --   39  32   ALK  28*   --   36*  29*       Recent Labs      03/30/14   1645  03/31/14   0245  03/31/14   0945  04/01/14   1330  04/02/14   0230   WBC  17.8*  15.6*  15.9*  10.2*  8.7   HGB  9.5*  8.6*  8.9*   9.3*  8.5*   HCT  29.1*  26.4*  27.7*  28.6*  26.0*   MCV  86.1  86.0  87.4  87.2  86.7   PLT  191  165  191  209  205   SEG  91*  82*  83*   --    --    LYMPHS  4*  4*  9*   --    --    MONOS  5  4*  8   --    --        Recent Labs      03/31/14   0245  04/01/14   0101  04/01/14   1630  04/02/14   0230   PTT  23.9*   --  33.7  36.3*   INR  1.2  1.2   --   1.2     Imaging  CXR - Lines and tube are in place    ASSESSMENT AND PLAN  Edwin Sandoval is a 48 year old male patient with history DM, HTN, CKD who is in ICU after PTE on 03/29/14. Good HD results after surgery. 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     1-CTEPH s/P PTE on 10/29, POD 4 with good HD results. Pre OP on Adempas and macitentan,  RHC PAP 53/19/30, CO/CI 6.7/2.7, PCWP 20, PVR 101.  HD post OP after extubation, off pressors and sedation: CVP 12, PAP 32/14/22, PCWP 10, CO/CI 7.3/3, SVR 871, PVR 132  2- Hypoxic respiratory failure post PTE. Extubated POD1   3-A fib post OR. Now in sinus rhythm.   4-CKD: Baseline creatinine around 2,  History of wilms tumor s/p nephrectomy  5-DM on insulin at home. Hypoglycemic episodes overnight.   6-OSA on 15 cm water CPAP at night  7-Hx of hypertension, HLP and hypothyroidism    Plan   -Oxygen therapy  -Continue heparin gtt ( goal PTT 66-105) bridging to warfarin( home dose warfarin 2.5 alternating with 3 mg daily)  -Continue CT. Continue cefuroxime while chest tube in  -Out of bed and ambulation. IS as tolerated.    -Decrease lantus to 40 BID and pre prandial insulin to 25 TID. Follow with endocrinology.   -Pain control with opioids   -Continue synthroid  -Continue Coreg and anti hyperlipidemia meds   -CPAP at night  -Full code/Heparin      This patient was seen and discussed with Edwin Sandoval     Edwin Sandoval PCCM fellow     Pulmonary Vascular Attending Addendum       Patient seen and examined with Edwin Sandoval.       Sitting in chair in good spirits; no complaints; back in SR early this AM        Afebrile, BP 182X-937J systolic (coreg just resumed), O2 sat 100% 3 liters       Lungs clear       No S3; pericardial scratch       Sternal dressing dry       No LE edema    CXR: low lung volumes with patchy ASO vs atelx  CT output 260 cc yesterday, serous    BUN 45, Creat down to 1.82 with K+4.0, Na+140      Normal LFTs  WBC 8.7, Hct 26.0 with platelets 205,000    PTT 35.9 on 1630 units/hr heparin    Post PTE with good pulmonary HD result; CKD        With wires removed, coumadin AC; IV heparin bridge given episodic A fib        OK to transfer to 4 tele today; advance activities as able        Anti HTN meds        Diuretics PRN, monitoring renal function        Antibxs while CTs in place    Edwin R. Sire Poet, MD

## 2014-04-02 NOTE — Consults (Signed)
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 Asked by Dr. Cristal Deer to provide opinion regarding treatment options for diabetes management.    DM2 since 1995, home regimen includes insulin U-500 TID 30u/28u/20u markings administered at 0930,1130, and 1830 respectively. Pt and wife Edwin Sandoval) stated that doses have been adjusted, but could not precise by how much. Pt does not miss insulin doses. Reported hypoglycemic episodes about 1-2 X week usually around 0230 in the 40s; treats them with PBJ sandwich, crackers , granola, or milk. Denied retinopathy, sees a nephrologist q4mofor nephropathy, and feels tingling in both legs when sitting down after a long day at work. Monitors BGs TID with 80% of values about 120. Unknown meter make/model; believes it to be Bayer, but from product description it appears to be Verio. Pt has sufficient test supplies.    Beverage of choice is water and diet sodas. Enjoys [employer]. [place]. Has attended diabetes education sessions about 5 years ago. He uses the treadmill 2X week and needs oxygen.    Patient was transitioned off insulin drip on Saturday morning to lantus 60u bid and lispro 40 units qAC along with custom correctional scale. Glucoses ran low yesterday and overnight. Per patient, he eats a lot more at home and nurses have been giving him juices overnight to keep glucoses up.     ROS:  no f/c, weight gain/loss, chest pain/palpitations, SOB, cough, abd pain, n/v, diarrhea/const, BRBPR/melena, dysuria/hematuria, orthopnea/PND/LEE. No polyuria, polydipsia or blurry vision.  No numbness, tingling, pain or new lesions B  feet.    PMHx:  1. 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 DM II  3. Hypothyroidism  4. HTN  5. OSA (CPAP 15)  6. Laparoscopic cholecytstectomy, 2009  7. Chronic renal insufficiency    SocHx:  T: denies  E: denies  D: denies    FamHx:positive for diabetes    ALL: Vicodin    MEDS:  Current Facility-Administered Medications   Medication    acetaminophen (TYLENOL) solution 650 mg    carvedilol (COREG) tablet 25 mg    ceFUROXime (ZINACEF) 1,500 mg in sodium chloride 0.9 % 50 mL IVPB    dextrose 50 % solution 12.5 g    dextrose-sodium chloride 5%-0.45% infusion    docusate sodium (COLACE) capsule 250 mg    fenofibrate (TRICOR) tablet 145 mg    glucagon (GLUCAGON) injection 1 mg    glucose chewable tablet 16 g    glucose oral gel 1 Tube    heparin 25,000 units/500 mL infusion (50 units/mL) - PREMIX IN 1/2 NS    insulin glargine (LANTUS) injection 40 Units    insulin lispro (HUMALOG) injection 0-40 Units    insulin lispro (HUMALOG) injection 25 Units    levothyroxine (SYNTHROID) tablet 75 mcg    morphine injection 2 mg    nalOXone (NARCAN) injection 0.1 mg    omega-3 acid ethyl esters (LOVAZA) capsule 2 g    ondansetron (ZOFRAN) 8 mg in sodium chloride 0.9 % 50 mL IVPB    oxyCODONE (ROXICODONE) tablet 10 mg    oxyCODONE (ROXICODONE) tablet 5 mg    prazosin (MINIPRESS) capsule  1 mg    prochlorperazine (COMPAZINE) injection 10 mg    ramelteon (ROZEREM) tablet 8 mg    rosuvastatin (CRESTOR) tablet 20 mg    senna (SENOKOT) tablet 17.2 mg    warfarin (COUMADIN) tablet 3 mg       PHYSICAL EXAM  Temperature:  [98.3 F (36.8 C)-98.4 F (36.9 C)] 98.3 F (36.8 C) (11/02 0800)  Blood pressure (BP): (110-153)/(52-74) 153/74 mmHg (11/02 1000)  Heart Rate:  [64-99] 68 (11/02 1100)  Respirations:  [15-27] 15 (11/02 1100)  Pain Score:  [-] Patient Sleeping, Respiratory Assessment Done (11/02 1100)  O2 Device:  [-] Nasal cannula (11/02 1100)  O2 Flow Rate (L/min):  [2 l/min-3 l/min] 3 l/min (11/02  1100)  SpO2:  [93 %-100 %] 100 % (11/02 1100)  Body mass index is 37.71 kg/(m^2).    GEN: NAD  HENT: NC/AT, OP clear  Eyes: PERRL, EOMI  NECK: supple, no LAD  CV: RRR  RESP: breathing comfortably  ABD: soft, ND  EXT: no edema  NEURO: no tremor    LABS:  Lab Results   Component Value Date    A1C 8.2 03/29/2014     Lab Results   Component Value Date    NA 140 04/02/2014    K 4.0 04/02/2014    CL 103 04/02/2014    BICARB 25 04/02/2014    BUN 45 04/02/2014    CREAT 1.82 04/02/2014    GLU 53 04/02/2014    [place] 8.2 04/02/2014     No results found for: CHOL, HDL, LDLCALC, TRIG, LDLDIRECT  Lab Results   Component Value Date    AST 35 04/02/2014    ALT 32 04/02/2014    ALK 29 04/02/2014    TP 5.8 04/02/2014    ALB 2.9 04/02/2014    TBILI 0.24 04/02/2014    DBILI <0.2 03/31/2014       Diet: carb limited    Blood Sugars reviewed:                            AM      Lu        Di         HS       O/N    11/1  115 91 68/114 168/53  Lantus  60   60  Lispro  40 40 40 3    11/2  114  Lantus  40  LIspro    A/P:  Edwin Sandoval is a 48 year old male with 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 A1C=8.2%(10/29) on U500 as outpatient.    Glucoses running low yesterday evening and overnight. Patient states that he eats much less at home than he does in the hospital. Will decrease lantus and lispro today.    Inpatient Recs:   - Decrease Lantus 60 to 40 units BID  - Decrease Lispro 40 to 25 units qac when starting carb limited diet (RN to give 0, 1/2, whole dose based on % tray consumed)  - Continue Lispro custom dose scale ACHS as follows:    qAC qHS  150-175 6 3  176-200 12 6  201-225 18 9  226-250 24 12   251-275 30 15  276-300 36 18  >300  42  20    Outpatient Recs  - Resume oupt insulin regimen; doses TBD  pending BG trends during hospital stay  - Follow-up with PCP within 1-2 weeks of d/c      Thank you for this interesting consult, will cont to follow.  Please page [phone] with questions.

## 2014-04-02 NOTE — Interdisciplinary (Signed)
14:15   SW note    I was informed by CM that pt wife had questions about a form and I was asked to see her. I met with pt and his wife, Brayton Layman [phone]), at bed side and introduced myself and role. Pt is lying in bed with O2 in place and appears to be in a good mood. He informed me that he has already walked with PT and is a awaiting for a bed on a PCU floor.   Wife showed me som [employer] insurance forms that she would like some help with. She was provided with a copy and the original was handed to PTE fellow, and asked to assist with its completion. Fellow to return form to family who was informed. SW remains available.    Lolita Cram., LMSW  Ph: [phone]  Pager: [phone]

## 2014-04-02 NOTE — Plan of Care (Signed)
Problem: Tissue Perfusion, Cardiopulmonary - Altered  Goal: Early recognition of deterioration  Outcome: Met    Problem: Falls, Risk of  Goal: Keep patient free from falls utilizing universal fall precautions  Outcome: Met    Problem: Alteration in Blood Glucose  Goal: Glucose level within specified parameters  Outcome: Not Met  Decrease in blood sugar, juices given. Pt states he feels better and blood glucose returns to normal range.     Problem: Discharge Planning  Goal: Participation in care planning  Outcome: Met  Goal: Verbalizes/demonstrates knowledge of discharge instructions  Outcome: Not Met  Deferred at this time until discharge instructions ordered.  Goal: Verbalize knowledge of prescribed medication management plan  Outcome: Met  Goal: Able to perform ADL- independently or with minimal assist  Outcome: Met    Problem: Pain - Acute  Goal: Communication of presence of pain  Outcome: Met  Goal: Control of acute pain  Outcome: Met  Goal: Knowledge of pain management methods  Outcome: Met    Problem: Bleeding, Risk of  Goal: Absence of active bleeding  Outcome: Met  Goal: Absence of impaired coagulation signs and symptoms  Outcome: Resolved Date Met:  04/02/14  Goal: Knowledge of bleeding precautions  Outcome: Met    Problem: Breathing Pattern - Ineffective  Goal: Respiratory rate, rhythm and depth return to baseline  Outcome: Not Met  Requires supplemental O2 while awake, home CPAP at night.  Goal: Arterial blood gas values and/or saturation return to baseline  Outcome: Resolved Date Met:  04/02/14    Problem: Skin Integrity- Intact patient high risk for impaired skin integrity Braden scale less than or equal to 18  Goal: Skin remains intact  Outcome: Met  Surgical dressings CDI.  However, patient intubated/sedated. Remains on pressors and braden score <18.  Goal: Knowledge of skin protection measures  Outcome: Met    Problem: Skin Integrity Impaired: Secondary to pressure ulcer(admission or hospital  acquired), cellulitis, incontinence, surgical wound/incision, chronic wound or skin disease  Goal: Absence of infection signs and symptoms  Change when soiled, integrity impaired, physican order, or, no longer than 7 days.   Outcome: Met  Goal: Decrease in wound size: 10-15% weekly  Outcome: Met  Goal: Prevent further skin compromise  Outcome: Met

## 2014-04-03 LAB — GLUCOSE (POCT)
Glucose (POCT): 90 mg/dL (ref 70–115)
Glucose (POCT): 173 mg/dL — ABNORMAL HIGH (ref 70–115)
Glucose (POCT): 112 mg/dL (ref 70–115)
Glucose (POCT): 131 mg/dL — ABNORMAL HIGH (ref 70–115)

## 2014-04-03 LAB — BASIC METABOLIC PANEL, BLOOD
Anion Gap: 12 mmol/L (ref 7–15)
BUN: 38 mg/dL — ABNORMAL HIGH (ref 6–20)
Bicarbonate: 27 mmol/L (ref 22–29)
Calcium: 8.8 mg/dL (ref 8.5–10.6)
Chloride: 101 mmol/L (ref 98–107)
Creatinine: 1.68 mg/dL — ABNORMAL HIGH (ref 0.67–1.17)
GFR: 44 mL/min
Glucose: 84 mg/dL (ref 70–115)
Potassium: 4.5 mmol/L (ref 3.5–5.1)
Sodium: 140 mmol/L (ref 136–145)

## 2014-04-03 LAB — EMMI , DIABETES, TYPE 2: EMMI Video Order Number: 15805829130

## 2014-04-03 LAB — MAGNESIUM, BLOOD: Magnesium: 2.2 mg/dL (ref 1.7–2.6)

## 2014-04-03 LAB — PROTHROMBIN TIME, BLOOD
INR: 1.2
PT,Patient: 12.5 s (ref 9.7–12.5)

## 2014-04-03 LAB — APTT, BLOOD: PTT: 74.1 s — ABNORMAL HIGH (ref 25.0–34.0)

## 2014-04-03 LAB — PHOSPHORUS, BLOOD: Phosphorous: 2.9 mg/dL (ref 2.7–4.5)

## 2014-04-03 MED ORDER — INSULIN LISPRO (HUMAN) 100 UNIT/ML SC SOLN (CUSTOM)
1.0000 [IU] | Freq: Four times a day (QID) | INTRAMUSCULAR | Status: DC
Start: 2014-04-03 — End: 2014-04-13
  Administered 2014-04-03 – 2014-04-05 (×2): 3 [IU] via SUBCUTANEOUS
  Administered 2014-04-05 – 2014-04-06 (×2): 4 [IU] via SUBCUTANEOUS
  Administered 2014-04-07: 12:00:00 6 [IU] via SUBCUTANEOUS
  Administered 2014-04-07 – 2014-04-09 (×5): 4 [IU] via SUBCUTANEOUS
  Administered 2014-04-09 – 2014-04-11 (×2): 3 [IU] via SUBCUTANEOUS
  Administered 2014-04-11: 14:00:00 6 [IU] via SUBCUTANEOUS
  Administered 2014-04-12: 18:00:00 3 [IU] via SUBCUTANEOUS
  Administered 2014-04-12: 4 [IU] via SUBCUTANEOUS
  Filled 2014-04-03: qty 3
  Filled 2014-04-03: qty 6
  Filled 2014-04-03: qty 5
  Filled 2014-04-03: qty 1
  Filled 2014-04-03: qty 3
  Filled 2014-04-03: qty 1
  Filled 2014-04-03: qty 3
  Filled 2014-04-03 (×2): qty 4
  Filled 2014-04-03 (×2): qty 3
  Filled 2014-04-03: qty 5
  Filled 2014-04-03: qty 1

## 2014-04-03 MED ORDER — POLYETHYLENE GLYCOL 3350 OR PACK
17.0000 g | PACK | Freq: Every day | ORAL | Status: DC | PRN
Start: 2014-04-03 — End: 2014-04-13
  Administered 2014-04-03 – 2014-04-09 (×6): 17 g via ORAL
  Filled 2014-04-03 (×7): qty 1

## 2014-04-03 MED ORDER — WARFARIN SODIUM 5 MG OR TABS
5.0000 mg | ORAL_TABLET | Freq: Every evening | ORAL | Status: DC
Start: 2014-04-03 — End: 2014-04-04
  Administered 2014-04-03: 5 mg via ORAL
  Filled 2014-04-03: qty 1

## 2014-04-03 NOTE — Plan of Care (Signed)
Problem: Breathing Pattern - Ineffective   Goal: Respiratory rate, rhythm and depth return to baseline   Outcome: Not Met   Chest tube in place to suction, with serosanguinous drainage noted. Denies shortness of breath, no acute respiratory distress noted at this time. Pt wears CPAP at night. 02 sat 98-100% on 3L. Patient aware to notify RN for any distress. Continue to monitor.     Problem: Discharge Planning   Goal: Participation in care planning   Outcome: Not Met   No orders at this time for discharge, continue with plan of care.     Problem: Pain - Acute   Goal: Communication of presence of pain   Outcome: Met   Patient states pain at comfortable level of 3/10, no pain meds requested. Patient aware to notify RN for pain assessment and intervention as needed. Verbalizes understanding of importance of pain control in recovery and not to allow pain to reach intolerable level. Continue to monitor.     Problem: Bleeding, Risk of  Goal: Absence of active bleeding  Outcome: Met  VSS. No s/s of active bleeding noted. Pt on Heparin gtt @ 1840 units/hr. Next PTT at 05:30. Will continue to monitor for bleeding.     Problem: Tissue Perfusion - Cardiopulmonary, Altered   Goal: Hemodynamic stability   Outcome: Met   VS stable as noted, afebrile, denies chest pain or dizziness. SR on monitor. Patient aware to notify RN for any chest discomfort or dizziness. Continue to monitor.     Problem: Falls - Risk of   Goal: Absence of falls   Outcome: Met   Patient remains free from fall/injury at this time. Call light/bedside table within reach, uses appropriately for assistance as needed. Bed alarm refused. Hourly rounding maintained for safety. Continue to monitor.     Problem: Alteration in Blood Glucose  Goal: Glucose level within specified parameters  Outcome: Not Met  BG 206 at 2100. SS insulin given as ordered. Will continue to monitor for acute changes in patient status. Pt educated in signs/symptoms of hypo/hyperglycemia.  Will call RN if any changes noted.      Problem: Skin Integrity Impaired: Secondary to pressure ulcer(admission or hospital acquired), cellulitis, incontinence, surgical wound/incision, chronic wound or skin disease   Goal: Absence of infection signs and symptoms   Outcome: Met   No s/s of skin infection or new skin breakdown noted at this time. Mid-sternal incision with Prevena wound vac c/d/i. Patient verbalizes and demonstrates understanding importance of repositioning self every 2 hours while resting in bed and every 15 minutes while sitting in chair to maintain skin integrity. Continue to monitor.

## 2014-04-03 NOTE — Progress Notes (Signed)
PCCM Fellow Progress Note     LOS: 6 days      ID  48 year old male patient with history DM, HTN, CKD who is in ICU after PTE on 03/29/14. Surgery without complications.     INTERVAL EVENTS   -Tele showed no A fib episodes   -5/10 pain around the surgical incision  -Last BM 2 days ago, given senokot last night  -No more hypoglycemic episodes last night( highest blood sugar 206, but mainly in low 100s)  -PTT at goal on 1840 u of heparin  -Used home CPAP last night     I/O: negative 522 cc, CT 235, Urine 1.9      OBJECTIVE   Drips   dextrose-sodium chloride 5%-0.45% Stopped (03/31/14 1200)    heparin 25,000 units/500 mL 1,840 Units/hr (04/03/14 0805)        Medications   carvedilol  25 mg BID WC    ceFUROXime (ZINACEF) IVPB  1,500 mg Q12H NR    docusate sodium  250 mg BID    fenofibrate  145 mg Daily    insulin glargine  40 Units Q12H    insulin lispro  0-40 Units 4x Daily WC    insulin lispro  25 Units TID WC    levothyroxine  75 mcg QAM AC    omega-3 acid ethyl esters  2 g BID    prazosin  1 mg Q12H    rosuvastatin  20 mg QPM    senna  2 tablet HS    warfarin  3 mg QPM       Vital Signs  Temp  Avg: 98.6 F (37 C)  Min: 98.2 F (36.8 C)  Max: 98.8 F (37.1 C)  Pulse  Avg: 70.5  Min: 65  Max: 79  BP  Min: 134/58  Max: 157/71  No Data Recorded  Resp  Avg: 17  Min: 13  Max: 21  SpO2  Avg: 99.5 %  Min: 98 %  Max: 100 %        Date 03/30/14 0700 - 03/31/14 0659   Shift 0700-1459 1500-2259 2300-0659 24 Hour Total   I  N  T  A  K  E   I.V. 431.7   431.7    NG/GT 0   0    Shift Total 431.7   431.7   O  U  T  P  U  T   Urine 635   635    Emesis/NG Output 50   50    Chest Tube 90   90    Shift Total 775   775   Weight (kg) 111.2 111.2 111.2 111.2         Lines and tubes  Active PICC Line / CVC Line / PIV Line / Drain / Airway / Intraosseous Line / Epidural Line / ART Line / Line Type / Wound     Name: Placement date: Placement time: Site: Days:    Peripheral IV - 18 G Left Hand 03/29/14  0545  Hand  5     Peripheral IV - 20 G Right Hand 04/01/14  0001  Hand  2    Chest Tube -  Anterior Mediastinal (x3) 03/29/14  1200  Mediastinal (x3)  4    Incision -  03/29/14 1500 Sternum Anterior;Mid 03/29/14  1500   4          Physical Exam  Gen: NAD, conversing comfortably.  Eyes: EOMI, PERL, anicteric  HENT:  Atraumatic, normocephalic, clear oropharynx  Neck: No JVD, trachea midline, no lymphadenopathy, supple  CV: RRR with rubs from chest tubes, No M/R/G/S3. Chest tubes in place, cordis removed  Resp: improved basilar crackles, no intercostal retractions/normal effort; sternotomy wound with Provena in place  Abdo: NTND, soft, no masses, obese.  Ext: Trace to no edema, no clubbing or cyanosis; no joint abnormalities  Neuro: No focal weakness or sensory deficit, CN grossly intact. Mild numbness of R hand index, middle fingers and thumb  Pysch: Appropriate affect and mood, good insight. AnOx3  Skin: Normal temperature, no rash or nodules    Labs  Recent Labs      04/01/14   1330  04/02/14   0230  04/02/14   1930  04/03/14   0624   NA  139  140   --   140   K  4.2  4.0   --   4.5   CL  100  103   --   101   BICARB  25  25   --   27   BUN  44*  45*   --   38*   CREAT  2.01*  1.82*   --   1.68*   [place]  8.7  8.2*   --   8.8   MG  2.1  2.1   --   2.2   PHOS  3.0  3.3  2.9  2.9   TP  6.5  5.8*   --    --    ALB  3.5  2.9*   --    --    TBILI  0.30  0.24   --    --    AST  46*  35   --    --    ALT  39  32   --    --    ALK  36*  29*   --    --        Recent Labs      03/31/14   0945  04/01/14   1330  04/02/14   0230   WBC  15.9*  10.2*  8.7   HGB  8.9*  9.3*  8.5*   HCT  27.7*  28.6*  26.0*   MCV  87.4  87.2  86.7   PLT  191  209  205   SEG  83*   --    --    LYMPHS  9*   --    --    MONOS  8   --    --        Recent Labs      04/02/14   0230  04/02/14   0845  04/02/14   1930  04/03/14   0624   PTT  36.3*  35.9*  55.0*  74.1*   INR  1.2   --   1.1  1.2     Imaging  CXR - Lines and tube are in place    ASSESSMENT AND PLAN  Edwin Sandoval is a 48 year old male patient with history DM, HTN, CKD who is in ICU after PTE on 03/29/14. Good HD results after surgery. 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     1-CTEPH s/P PTE on 10/29, POD 4 with good HD results. Pre OP on Adempas and macitentan,  RHC PAP 53/19/30, CO/CI 6.7/2.7, PCWP 20, PVR 101.  HD  post OP after extubation, off pressors and sedation: CVP 12, PAP 32/14/22, PCWP 10, CO/CI 7.3/3, SVR 871, PVR 132  2- Hypoxic respiratory failure post PTE. Extubated POD1   3-A fib post OR, converted to sinus spontaneously. Still in sinus rhythm.   4-CKD: Baseline creatinine around 2, improved after surgery. History of wilms tumor s/p nephrectomy  5-Type II DM. Better controlled today.    6-OSA on 15 cm water CPAP at night  7-Hx of hypertension, HLP and hypothyroidism    Plan   -Oxygen therapy  -Continue heparin gtt ( goal PTT 66-105) bridging to warfarin( home dose warfarin 2.5 alternating with 3 mg daily). Give 5 mg today.   -Continue CT. Continue cefuroxime while chest tube in  -Out of bed and ambulation. IS as tolerated.    -Continue lantus 40 BID and pre prandial insulin 25 TID. Follow with endocrinology.   -Pain control with opioids   -Continue synthroid  -Continue Coreg and anti hyperlipidemia meds   -CPAP at night  -Full code/Heparin      This patient was seen and discussed with Dr.[name]     Edwin Sandoval PCCM fellow     Pulmonary Vascular Attending Addendum         Patient seen and examined with Dr. [name].         In good spirits; active; no complaints.         Afebrile, VSS with O2 sat 100% 3 liters         Better aeration lung bases this AM         Normal S1, S2 with soft pericardial scratch         Sternal dressing dry         No LE edema    CT drainage serous, 235 cc yesterday  BUN 38, Creat 1.68 (decreased), K+4.5, Na+140  PTT 74.1 on 1840 units/hr heparin  INR 1.2    Post PTE with good pulmonary HD result; CKD        Advance coumadin AC with IV heparin bridge         Hopefully CTs out soon        Ambulate/OOB to chair    Edwin Saxon R. Royale Lennartz, MD

## 2014-04-03 NOTE — Progress Notes (Addendum)
Daily Progress Note:  04/03/2014     Current Hospital Stay:   6 days - Admitted on: 03/28/2014    Subjective:  POD#5 s/p PTE    Resting comfortably in bed. No complaints    Objective:  tmax 98.8. Vss. o2 sat 100%3LNC    Vital Signs:  Temperature:  [98.2 F (36.8 C)-98.8 F (37.1 C)] 98.8 F (37.1 C) (11/03 0800)  Blood pressure (BP): (134-157)/(58-74) 141/67 mmHg (11/03 0800)  Heart Rate:  [65-79] 70 (11/03 0800)  Respirations:  [13-21] 18 (11/03 0800)  Pain Score:  [-] 6 (11/03 0846)  O2 Device:  [-] Nasal cannula (11/03 0800)  O2 Flow Rate (L/min):  [3 l/min] 3 l/min (11/03 0800)  SpO2:  [98 %-100 %] 100 % (11/03 0800)    Wt Readings from Last 1 Encounters:   04/03/14 122 kg (268 lb 15.4 oz)       Intake/Output (Current Shift):    Intake/Output Summary (Last 24 hours) at 04/03/14 0903  Last data filed at 04/03/14 8546   Gross per 24 hour   Intake 1850.81 ml   Output   2105 ml   Net -254.19 ml   CT: 235cc serosang on suction    Current Facility-Administered Medications   Medication    acetaminophen (TYLENOL) solution 650 mg    carvedilol (COREG) tablet 25 mg    ceFUROXime (ZINACEF) 1,500 mg in sodium chloride 0.9 % 50 mL IVPB    dextrose 50 % solution 12.5 g    dextrose-sodium chloride 5%-0.45% infusion    docusate sodium (COLACE) capsule 250 mg    fenofibrate (TRICOR) tablet 145 mg    glucagon (GLUCAGON) injection 1 mg    glucose chewable tablet 16 g    glucose oral gel 1 Tube    heparin 25,000 units/500 mL infusion (50 units/mL) - PREMIX IN 1/2 NS    insulin glargine (LANTUS) injection 40 Units    insulin lispro (HUMALOG) injection 0-40 Units    insulin lispro (HUMALOG) injection 25 Units    levothyroxine (SYNTHROID) tablet 75 mcg    morphine injection 2 mg    nalOXone (NARCAN) injection 0.1 mg    omega-3 acid ethyl esters (LOVAZA) capsule 2 g    ondansetron (ZOFRAN) 8 mg in sodium chloride 0.9 % 50 mL IVPB    oxyCODONE (ROXICODONE) tablet 10 mg    oxyCODONE (ROXICODONE) tablet 5 mg     polyethylene glycol (MIRALAX) packet 17 g    prazosin (MINIPRESS) capsule 1 mg    prochlorperazine (COMPAZINE) injection 10 mg    ramelteon (ROZEREM) tablet 8 mg    rosuvastatin (CRESTOR) tablet 20 mg    senna (SENOKOT) tablet 17.2 mg    warfarin (COUMADIN) tablet 3 mg       Physical Exam:  General Appearance: healthy, alert, no distress, pleasant affect, cooperative, skin warm, dry, and pink.  Skin:  negative.    Laboratory data:   Lab Results   Component Value Date    NA 140 04/03/2014    K 4.5 04/03/2014    CL 101 04/03/2014    BICARB 27 04/03/2014    BUN 38* 04/03/2014    CREAT 1.68* 04/03/2014    GLU 84 04/03/2014    [place] 8.8 04/03/2014     Lab Results   Component Value Date    WBC 8.7 04/02/2014    HGB 8.5* 04/02/2014    HCT 26.0* 04/02/2014    PLT 205 04/02/2014     Lab Results  Component Value Date    AST 35 04/02/2014    ALT 32 04/02/2014    ALK 29* 04/02/2014    TBILI 0.24 04/02/2014    TP 5.8* 04/02/2014    ALB 2.9* 04/02/2014     Lab Results   Component Value Date    INR 1.2 04/03/2014    PTT 74.1* 04/03/2014       Assessment and Plan:  POD#5 s/p PTE    -anticoagulation with heparin gtt, Coumadin. INR today 1.2  -DM2: on insulins  -hypothyroidism: on synthroid  -stage 3 CKD: Cr 1.68 today. UOP 1.9L  -cont CTs with 235cc/24hrs serosanguinous drainage    Angela Bautista, MS, PA-C  Cardiothoracic Surgery  Pager [phone]      Addendum:  POD5.  Chest tubes too wet to remove.  On coumadin/heparin.  I agree with the above plan.

## 2014-04-03 NOTE — Plan of Care (Signed)
Problem: Breathing Pattern - Ineffective   Goal: Respiratory rate, rhythm and depth return to baseline   Outcome: Not Met   Chest tube in place to suction, with serosanguinous drainage noted. Denies shortness of breath, no acute respiratory distress noted at this time. Pt wears CPAP at night. 02 sat 98-100% on 3L. Patient aware to notify RN for any distress. Continue to monitor.     Problem: Discharge Planning   Goal: Participation in care planning   Outcome: Not Met   No orders at this time for discharge, continue with plan of care.     Problem: Pain - Acute   Goal: Communication of presence of pain   Outcome: Met   Patient states pain at comfortable level of 3/10, no pain meds requested. Patient aware to notify RN for pain assessment and intervention as needed. Verbalizes understanding of importance of pain control in recovery and not to allow pain to reach intolerable level. Continue to monitor.     Problem: Bleeding, Risk of  Goal: Absence of active bleeding  Outcome: Met  VSS. No s/s of active bleeding noted. Pt on Heparin gtt @ 1840 units/hr. Next PTT at 05:30. Will continue to monitor for bleeding.     Problem: Tissue Perfusion - Cardiopulmonary, Altered   Goal: Hemodynamic stability   Outcome: Met   VS stable as noted, afebrile, denies chest pain or dizziness. SR on monitor. Patient aware to notify RN for any chest discomfort or dizziness. Continue to monitor.     Problem: Falls - Risk of   Goal: Absence of falls   Outcome: Met   Patient remains free from fall/injury at this time. Call light/bedside table within reach, uses appropriately for assistance as needed. Bed alarm refused. Hourly rounding maintained for safety. Continue to monitor.     Problem: Alteration in Blood Glucose  Goal: Glucose level within specified parameters  Outcome: Not Met  BG 131 at 2100. SS insulin not indicated. Will continue to monitor for acute changes in patient status. Pt educated in signs/symptoms of hypo/hyperglycemia. Will  call RN if any changes noted.     Problem: Skin Integrity Impaired: Secondary to pressure ulcer(admission or hospital acquired), cellulitis, incontinence, surgical wound/incision, chronic wound or skin disease   Goal: Absence of infection signs and symptoms   Outcome: Met   No s/s of skin infection or new skin breakdown noted at this time. Mid-sternal incision with Prevena wound vac c/d/i. Patient verbalizes and demonstrates understanding importance of repositioning self every 2 hours while resting in bed and every 15 minutes while sitting in chair to maintain skin integrity. Continue to monitor.

## 2014-04-03 NOTE — Interdisciplinary (Addendum)
Webpaged first call Dr. Heron NayKhalil, Verlon AuAnas: "Vernie ShanksDeLeon, Clayborne ArtistJohn Michael in [id number]: Do you still want phosphorus checked every 12 hours? Please clarify. Thank you."  Dr. Heron NayKhalil, Verlon AuAnas called back and gave a TORB that Ph q12 hours may be discontinued.

## 2014-04-03 NOTE — Plan of Care (Signed)
Problem: Tissue Perfusion, Cardiopulmonary - Altered  Goal: Early recognition of deterioration  Outcome: Met  SR on telemetry. S/p PTE (10/29) POD#5. MSI is covered with Prevena wound vac. Chest tube is connected to -20cmH20 suction and is draining SS drainage but is planned to be removed tomorrow depending on drainage output and chest xray findings tomorrow. VSS. Denied chest pain/sob/dyspnea. No edema noted. Continue to monitor.    Problem: Falls, Risk of  Goal: Keep patient free from falls utilizing universal fall precautions  Outcome: Met  Patient is a low fall risk. No falls/injuries noted. Fall risk measures are in place: non-skid socks, bed at its lowest position, door left open, 2/4 side rails are up. 4Ps are to be addressed during hourly rounding. Refused bed alarm. Instructed to call nurse for assistance when getting OOB. Patient stated understanding. Call light within reach.     Problem: Alteration in Blood Glucose  Goal: Glucose level within specified parameters  Outcome: Met  BG check q ac/hs and prn. Administer insulin per sliding scale or as ordered. Instructed patient to call nurse for any s/s of hypo/hyperglycemia. No s/s of hypo/hyperglycemia noted. Continue to monitor.         Problem: Discharge Planning  Goal: Participation in care planning  Outcome: Not Met  No order for discharge. Pt/family are very involved in patient's medical care and are receptive to learning. Update pt/family as needed.    Problem: Pain - Acute  Goal: Communication of presence of pain  Outcome: Met  Pt had incisional pain in the morning relieved by oxycodone 10m po x 1. Instructed patient to report pain when present and is made aware of current pain relief options. Pt stated understanding. Call light within reach. 4Ps are to be addressed during hourly rounding.     Problem: Bleeding, Risk of  Goal: Absence of active bleeding  Outcome: Met  On heparin drip at 1840 units/hour. Next PTT is tomorrow morning. Chest tube is  connected to -20cmH20 suction and is draining SS drainage but is planned to be removed tomorrow depending on drainage output and chest xray findings tomorrow.  H/H is stable. No active bleeding noted. Pt denied bloody urine/stools and/or any bleeding from any other body orifice. Pt denied dizziness. VSS. Fall measures are in place.    Problem: Breathing Pattern - Ineffective  Goal: Respiratory rate, rhythm and depth return to baseline  Outcome: Met  S/p PTE POD #5 (03/29/14). MSI is covered with Prevena wound vac. Chest tube is connected to -20cmH20 suction and is draining SS drainage but is planned to be removed tomorrow depending on drainage output and chest xray findings tomorrow. Respiration even, regular, and unlabored. SpO2=100% on 3L O2 per NC. Denied SOB/dyspnea. Instructed to call nurse immediately if symptoms mentioned or any other discomfort is present. Pt stated understanding. Call light within reach.         Problem: Skin Integrity- Intact patient high risk for impaired skin integrity Braden scale less than or equal to 18  Goal: Skin remains intact  Outcome: Met  MSI is covered with Prevena wound vac. Chest tube entry sites are covered with dressing, CDI. No pressure-related sore noted. Patient is able to turn self from side to side. Educated patient the importance of turning at least every 2 hours in order to prevent pressure ulcer and patient stated understanding.

## 2014-04-03 NOTE — Progress Notes (Signed)
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    Objective:  Temperature:  [97.7 F (36.5 C)-98.8 F (37.1 C)] 97.7 F (36.5 C) (11/03 1123)  Blood pressure (BP): (141-157)/(61-71) 151/61 mmHg (11/03 1123)  Heart Rate:  [67-79] 75 (11/03 1123)  Respirations:  [18-20] 18 (11/03 1123)  Pain Score:  [-] 0 (11/03 1123)  O2 Device:  [-] Nasal cannula (11/03 1123)  O2 Flow Rate (L/min):  [3 l/min] 3 l/min (11/03 1123)  SpO2:  [98 %-100 %] 100 % (11/03 1123)    GEN: NAD  Resp: unlabored    Diet: carb limited    Blood Sugars reviewed:                            AM      Marinda ElkLu        Di         HS       O/N  11/1   115  91  68/114 168/53  Lantus  60    60  Lispro   40  40  40  3    11/2   114 137 137 206  Lantus  40   40  Lispro   12 25 9     11/03  90 173  Lantus  40  Lispro  25    A/P: Ezekiel SlocumbJohn Michael Prete is a 48 year old male 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 BGs within range and pt taking an active role in determining final dose of Lispro to be taken pending PO intake. Given this AM fasting at 90mg /dL, will change d/c custom dose correction scale and start high dose scale achs.    Inpatient Recs:  -d/c custom correction insulin scale  -start lispro high dose correction scale achs  -continue Lantus 40 units BID  -continue Lispro 25 units ac qac meals (RN to give 0, 1/2, whole dose based on % tray consumed)    Outpatient Recs:  -Resume oupt insulin regimen; doses TBD pending BG trends during hospital stay  -Follow-up with PCP within 1-2 weeks of d/c    Will cont to follow. Please page [phone] with questions

## 2014-04-03 NOTE — Procedures (Signed)
FINAL PATHOLOGIC DIAGNOSIS:  A: Right pulmonary artery, thromboendarterectomy       -Organized thrombus with recanalization.  B: Left pulmonary artery, thromboendarterectomy       -Organized thrombus with recanalization.    SPECIMEN(S) SUBMITTED:  A: Right pulmonary thrombus  B: Left pulmonary thrombus    CLINICAL HISTORY:  48 year old male with pulmonary hypertension    GROSS DESCRIPTION:  A: The specimen (received in formalin, labeled with the patient's name,  medical record number, and "right PA thrombus") consists of multiple,  tubular, branching, white-tan fragments of soft tissue measuring in  aggregate 4.0 x 2.4 x 1.5 cm.  Representative sections are submitted in  cassette A1.  B: The specimen (received in formalin, labeled with the patient's name,  medical record number, and "left PA thrombus") consists of multiple,  tubular, branching, white-tan fragments of fibrous tissue measuring in  aggregate 3.0 x 2.5 x 0.8 cm.  Representative sections are submitted in  cassette A1.  ES/dlp  CONFIDENTIAL HEALTH INFORMATION: Health Care information is personal and  sensitive information. If it is being faxed to you it is done so under  appropriate authorization from the patient or under circumstances that do  not require patient authorization. You, the recipient, are obligated to  maintain it in a safe, secure and confidential manner. Re-disclosure  without additional patient consent or as permitted by law is prohibited.  Unauthorized re-disclosure or failure to maintain confidentiality could  subject you to penalties described in federal and state law.  If you have  received this report or facsimile in error, please notify the [place]  [facility] immediately and destroy the received document(s).    Material reviewed and Interpreted and  Report Electronically Signed by:  Einar PheasantGrace Lin M.D.,  Ph.D. [phone]([id number])  Attending Surgical Pathologist  04/03/14 12:58  Electronic Signature derived from a single  controlled access  password

## 2014-04-03 NOTE — Progress Notes (Signed)
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    Objective:  Temperature:  [97.7 F (36.5 C)-98.8 F (37.1 C)] 97.7 F (36.5 C) (11/03 1123)  Blood pressure (BP): (134-157)/(58-71) 151/61 mmHg (11/03 1123)  Heart Rate:  [67-79] 75 (11/03 1123)  Respirations:  [14-21] 18 (11/03 1123)  Pain Score:  [-] 0 (11/03 1123)  O2 Device:  [-] Nasal cannula (11/03 1123)  O2 Flow Rate (L/min):  [3 l/min] 3 l/min (11/03 1123)  SpO2:  [98 %-100 %] 100 % (11/03 1123)    GEN: NAD  Resp: unlabored    Diet: carb limited    Blood Sugars reviewed:                            AM      Marinda ElkLu        Di         HS       O/N  11/1   115  91  68/114 168/53  Lantus  60    60  Lispro   40  40  40  3    11/2   114 137 137 206  Lantus  40   40  Lispro   12 25 9     11/03  90 173  Lantus  40  Lispro  25    A/P: Edwin Sandoval is a 48 year old male 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 BGs within range and pt taking an active role in determining final dose of Lispro to be taken pending PO intake. Given this AM fasting at 90mg /dL, will change d/c custom dose correction scale and start high dose scale achs.    Inpatient Recs:  -d/c custom correction insulin scale  -start high dose correction scale achs  -continue Lantus 40 units BID  -continue Lispro 25 units ac qac meals (RN to give 0, 1/2, whole dose based on % tray consumed)    Outpatient Recs:  -Resume oupt insulin regimen; doses TBD pending BG trends during hospital stay  -Follow-up with PCP within 1-2 weeks of d/c    Education:    1. Self-monitor blood glucose:pt self-monitors at home; to watch Emmi video today as well    2. Healthy Eating:reviewed basics of healthy eating, carb portions, carb counting, and distributing carbs  across meals. Pt to target about 4-5 servings/meals. Mother and wife present; both supportive. Mother has diabetes and has attended diabetes classes in the past and contributed timely information during session regarding label reading. Pt likes sweets; discussed carb budgeting. Pt also substituted Crystal Light for Gatorade.     3. 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 He wears a pedometer at work.    4. Hypoglycemia:reviewed s/sx and treatment by rule of 15 and reporting glucose < 70 mg/dL U9+x2+ in a week to PCP for reevaluation of DM treatment regimen.    Will cont to follow. Please page [phone] with questions

## 2014-04-04 DIAGNOSIS — I450 Right fascicular block: Secondary | ICD-10-CM

## 2014-04-04 DIAGNOSIS — I4510 Unspecified right bundle-branch block: Secondary | ICD-10-CM

## 2014-04-04 DIAGNOSIS — Z136 Encounter for screening for cardiovascular disorders: Secondary | ICD-10-CM

## 2014-04-04 DIAGNOSIS — I4519 Other right bundle-branch block: Secondary | ICD-10-CM

## 2014-04-04 DIAGNOSIS — R9431 Abnormal electrocardiogram [ECG] [EKG]: Secondary | ICD-10-CM

## 2014-04-04 DIAGNOSIS — I2789 Other specified pulmonary heart diseases: Secondary | ICD-10-CM

## 2014-04-04 LAB — EMMI , DIABETES - CHECKING YOUR BLOOD SUGAR: EMMI Video Order Number: 12309182629

## 2014-04-04 LAB — ECG 12-LEAD
ATRIAL RATE: 64 {beats}/min
ECG INTERPRETATION: NORMAL
P AXIS: 46 degrees
PR INTERVAL: 134 ms
QRS INTERVAL/DURATION: 128 ms
QT: 472 ms
QTC INTERVAL: 486 ms
R AXIS: -45 degrees
T AXIS: 75 degrees
VENTRICULAR RATE: 64 {beats}/min

## 2014-04-04 LAB — GLUCOSE (POCT)
Glucose (POCT): 97 mg/dL (ref 70–115)
Glucose (POCT): 145 mg/dL — ABNORMAL HIGH (ref 70–115)
Glucose (POCT): 97 mg/dL (ref 70–115)
Glucose (POCT): 76 mg/dL (ref 70–115)
Glucose (POCT): 137 mg/dL — ABNORMAL HIGH (ref 70–115)

## 2014-04-04 LAB — APTT, BLOOD
PTT: 61.6 s — ABNORMAL HIGH (ref 25.0–34.0)
PTT: 62.9 s — ABNORMAL HIGH (ref 25.0–34.0)

## 2014-04-04 LAB — PROTHROMBIN TIME, BLOOD
INR: 1.2
PT,Patient: 12.8 s — ABNORMAL HIGH (ref 9.7–12.5)

## 2014-04-04 MED ORDER — INSULIN GLARGINE 100 UNIT/ML SC SOLN
35.0000 [IU] | Freq: Every evening | SUBCUTANEOUS | Status: DC
Start: 2014-04-04 — End: 2014-04-13
  Administered 2014-04-04 – 2014-04-12 (×8): 35 [IU] via SUBCUTANEOUS
  Filled 2014-04-04 (×8): qty 35

## 2014-04-04 MED ORDER — WARFARIN SODIUM 6 MG OR TABS
6.0000 mg | ORAL_TABLET | Freq: Every evening | ORAL | Status: DC
Start: 2014-04-04 — End: 2014-04-08
  Administered 2014-04-04 – 2014-04-07 (×4): 6 mg via ORAL
  Filled 2014-04-04 (×4): qty 1

## 2014-04-04 MED ORDER — INSULIN GLARGINE 100 UNIT/ML SC SOLN
40.0000 [IU] | Freq: Every morning | SUBCUTANEOUS | Status: DC
Start: 2014-04-05 — End: 2014-04-13
  Administered 2014-04-05 – 2014-04-13 (×9): 40 [IU] via SUBCUTANEOUS
  Filled 2014-04-04 (×11): qty 40

## 2014-04-04 NOTE — Progress Notes (Signed)
Diabetes/Endocrine Progress Note    Overnight: no acute events    Subjective: wife at bedside and counting carbs for herself as well; pt with good appetite and enjoying food selection    Objective:  Temperature:  [98.3 F (36.8 C)-100.1 F (37.8 C)] 99.8 F (37.7 C) (11/04 1554)  Blood pressure (BP): (127-155)/(62-72) 127/64 mmHg (11/04 1554)  Heart Rate:  [65-75] 67 (11/04 1554)  Respirations:  [18-20] 18 (11/04 1554)  Pain Score:  [-] 7 (11/04 1834)  O2 Device:  [-] Nasal cannula (11/04 1554)  O2 Flow Rate (L/min):  [3 l/min] 3 l/min (11/04 1554)  SpO2:  [100 %] 100 % (11/04 1554)    GEN: NAD  Resp: unlabored    Diet: carb limited    Blood Sugars reviewed:                            AM      Lu        Di         HS       O/N  11/1   115  91  68/114 168/53  Lantus  60    60  Lispro   40  40  40  3    11/2   114 137 137 206  Lantus  40   40  Lispro   12 25 9     11/03  90 173 112 131  Lantus  40   40  Lispro  25 25+3 25    11/04  97/145     Lantus      A/P: Edwin SlocumbJohn Michael Sandoval is a 48 year old male 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 Pt and wife engaged in making healthy choices and watching carb intake. Good glycemic control inpt on U-100. This AM fasting at 97; will make recs to decrease PM Lantus dose. Diabetes education addressed this afternoon with pt, wife, and mother.     Inpatient Recs:  -decrease lantus from 40/40 to 40/35 units BID  -continue lispro high dose correction scale achs  -continue Lispro 25 units ac qac meals (RN to give 0, 1/2, whole dose based on % tray consumed)    Outpatient Recs:  -Resume oupt insulin regimen; doses TBD pending BG trends during hospital stay  -Follow-up with PCP within 1-2 weeks of d/c    Education:    1. Self-monitor blood glucose: pt to monito BG qac meals and HS and when symptomatic    2. Healthy Eating: reviewed carb counting with pt, wife, and mother. Highly motivated family. Able to name  carb food sources, identify, and quantify them within usual pt's eating style. Encouraged equitable carb distribution across meals.    3. Hypoglycemia:reviewed s/sx and treatment by rule of 15 and reporting glucose < 70 mg/dL G9+x2+ in a week to PCP for reevaluation of DM treatment regimen.    4. Medication:educated about basal, nutritional, and correctional insulin. Given current BG trend, U-100 might be the option as outpt.    Thank you for this interesting consult, will cont to follow    Will cont to follow. Please page [phone] with questions

## 2014-04-04 NOTE — Plan of Care (Signed)
Problem: Tissue Perfusion, Cardiopulmonary - Altered  Goal: Early recognition of deterioration  Outcome: Met  SR on telemetry. S/p PTE (10/29) POD#6. MSI is covered with Prevena wound vac. Chest tube is connected to -20cmH20 suction and is draining SS drainage but is planned to be removed tomorrow depending on drainage output and chest xray findings tomorrow. VSS. Denied chest pain/sob/dyspnea. No edema noted. Continue to monitor.    Problem: Falls, Risk of  Goal: Keep patient free from falls utilizing universal fall precautions  Outcome: Met  Patient is a low fall risk. No falls/injuries noted. Fall risk measures are in place: non-skid socks, bed at its lowest position, door left open, 2/4 side rails are up. 4Ps are to be addressed during hourly rounding. Refused bed alarm. Instructed to call nurse for assistance when getting OOB. Patient stated understanding. Call light within reach.     Problem: Alteration in Blood Glucose  Goal: Glucose level within specified parameters  Outcome: Met  BG check q ac/hs and prn. Administer insulin per sliding scale or as ordered. Instructed patient to call nurse for any s/s of hypo/hyperglycemia. No s/s of hypo/hyperglycemia noted. Continue to monitor.    Problem: Discharge Planning  Goal: Participation in care planning  Outcome: Not Met  No order for discharge. Pt/family are very involved in patient's medical care and are receptive to learning. Update pt/family as needed.    Problem: Pain - Acute  Goal: Communication of presence of pain  Outcome: Met  Pt had incisional pain in the morning relieved by oxycodone 44m po x 1. Pt needed rescue dose of morphine 222mIV x 1 with relief. Instructed patient to report pain when present and is made aware of current pain relief options. Pt stated understanding. Call light within reach. 4Ps are to be addressed during hourly rounding.     Problem: Bleeding, Risk of  Goal: Absence of active bleeding  Outcome: Met  On heparin drip at 1945  units/hour. Next PTT is at 1300. Chest tube is connected to -20cmH20 suction and is draining SS drainage but is planned to be removed tomorrow depending on drainage output and chest xray findings tomorrow. H/H is stable. No active bleeding noted. Pt denied bloody urine/stools and/or any bleeding from any other body orifice. Pt denied dizziness. VSS. Fall measures are in place.    Problem: Breathing Pattern - Ineffective  Goal: Respiratory rate, rhythm and depth return to baseline  Outcome: Met  S/p PTE POD #6 (03/29/14). MSI is covered with Prevena wound vac. Chest tube is connected to -20cmH20 suction and is draining SS drainage but is planned to be removed tomorrow depending on drainage output and chest xray findings tomorrow. Respiration even, regular, and unlabored. SpO2=100% on 3L O2 per NC. Denied SOB/dyspnea. Instructed to call nurse immediately if symptoms mentioned or any other discomfort is present. Pt stated understanding. Call light within reach.     Problem: Skin Integrity- Intact patient high risk for impaired skin integrity Braden scale less than or equal to 18  Goal: Skin remains intact  Outcome: Met  MSI is covered with Prevena wound vac. Chest tube entry sites are covered with dressing, CDI. No pressure-related sore noted. Patient is able to turn self from side to side. Educated patient the importance of turning at least every 2 hours in order to prevent pressure ulcer and patient stated understanding.

## 2014-04-04 NOTE — Progress Notes (Addendum)
Daily Progress Note:  04/04/2014     Current Hospital Stay:   7 days - Admitted on: 03/28/2014    Subjective:  POD#6 s/p PTE    Resting comfortably in bed. No complaints    Objective:  tmax 100.1. Vss. 2o sat 100%3LNC    Vital Signs:  Temperature:  [97.7 F (36.5 C)-100.1 F (37.8 C)] 98.7 F (37.1 C) (11/04 0815)  Blood pressure (BP): (141-151)/(59-72) 143/72 mmHg (11/04 0815)  Heart Rate:  [65-75] 75 (11/04 0815)  Respirations:  [18-20] 18 (11/04 0815)  Pain Score:  [-] Patient Sleeping, Respiratory Assessment Done (11/04 0940)  O2 Device:  [-] Nasal cannula (11/04 0815)  O2 Flow Rate (L/min):  [3 l/min] 3 l/min (11/04 0815)  SpO2:  [100 %] 100 % (11/04 0815)    Wt Readings from Last 1 Encounters:   04/04/14 121.7 kg (268 lb 4.8 oz)       Intake/Output (Current Shift):    Intake/Output Summary (Last 24 hours) at 04/04/14 1115  Last data filed at 04/04/14 0840   Gross per 24 hour   Intake   1700 ml   Output   4304 ml   Net  -2604 ml   CT: 304cc serosang on suction    Current Facility-Administered Medications   Medication    acetaminophen (TYLENOL) solution 650 mg    carvedilol (COREG) tablet 25 mg    ceFUROXime (ZINACEF) 1,500 mg in sodium chloride 0.9 % 50 mL IVPB    dextrose 50 % solution 12.5 g    dextrose-sodium chloride 5%-0.45% infusion    docusate sodium (COLACE) capsule 250 mg    fenofibrate (TRICOR) tablet 145 mg    glucagon (GLUCAGON) injection 1 mg    glucose chewable tablet 16 g    glucose oral gel 1 Tube    heparin 25,000 units/500 mL infusion (50 units/mL) - PREMIX IN 1/2 NS    insulin glargine (LANTUS) injection 40 Units    insulin lispro (HUMALOG) injection 1-10 Units    insulin lispro (HUMALOG) injection 25 Units    levothyroxine (SYNTHROID) tablet 75 mcg    morphine injection 2 mg    nalOXone (NARCAN) injection 0.1 mg    omega-3 acid ethyl esters (LOVAZA) capsule 2 g    ondansetron (ZOFRAN) 8 mg in sodium chloride 0.9 % 50 mL IVPB    oxyCODONE (ROXICODONE) tablet 10 mg     oxyCODONE (ROXICODONE) tablet 5 mg    polyethylene glycol (MIRALAX) packet 17 g    prazosin (MINIPRESS) capsule 1 mg    prochlorperazine (COMPAZINE) injection 10 mg    ramelteon (ROZEREM) tablet 8 mg    rosuvastatin (CRESTOR) tablet 20 mg    senna (SENOKOT) tablet 17.2 mg    warfarin (COUMADIN) tablet 6 mg       Physical Exam:  General Appearance: healthy, alert, no distress, pleasant affect, cooperative, skin warm, dry, and pink.  Skin:  negative.    Laboratory data:   Lab Results   Component Value Date    NA 140 04/03/2014    K 4.5 04/03/2014    CL 101 04/03/2014    BICARB 27 04/03/2014    BUN 38* 04/03/2014    CREAT 1.68* 04/03/2014    GLU 84 04/03/2014    [place] 8.8 04/03/2014     No results found for: WBC, HGB, HCT, PLT  No results found for: AST, ALT, ALK, TBILI, DBILI, TP, ALB  Lab Results   Component Value Date    INR  1.2 04/04/2014    PTT 61.6* 04/04/2014       Assessment and Plan:  POD#6 s/p PTE    -anticoagulation with heparin gtt, Coumadin. INR today 1.2  -DM2: on insulins  -hypothyroidism: on synthroid  -stage 3 CKD: Cr 1.68. UOP 2.9L  -cont CTs with 304cc/24hrs serosanguinous drainage    Malachy Moan, MS, PA-C  Cardiothoracic Surgery  Pager [phone]      Addendum:  Patient evaluated and discussed with NP/PA.  I agree with the above note and plan.  Chest tube output still high, attempting clamp trial today.  Continue care with PTE service.

## 2014-04-04 NOTE — Interdisciplinary (Addendum)
Nutrition Initial Screening   RN trigger: none     48 year old male referred to us [place] consultation for the patient's pulmonary vascular disease and candidacy for PTE.      No past medical history on file.  Current diet order:Carb Limited  Chewing/Swallowing difficulty: none   Nausea/vomiting/diarrhea:none   Reported appetite: good   Documented PO intake:3/3,4/4,4/4,4/4  Height: 5\' 11"  (180.3 cm)  Weight - scale: 121.7 kg (268 lb 4.8 oz)   Body mass index is 37.44 kg/(m^2).  IBW:172  %IBW:156%  UBW:pt weight has been steadily decreasing from 272 lbs to 268 lbs within in the last 2 weeks due to fluid   Wt Hx;   Wt Readings from Last 20 Encounters:   04/04/14 121.7 kg (268 lb 4.8 oz)   03/22/14 126.508 kg (278 lb 14.4 oz)   03/21/14 123.378 kg (272 lb)   Skin integrity:Incision- Sternum Anterior;Mid from head to toe assessment  Labs:    Lab Results   Component Value Date    NA 140 04/03/2014    K 4.5 04/03/2014    BUN 38 04/03/2014    CREAT 1.68 04/03/2014    GLU 84 04/03/2014    [place] 8.8 04/03/2014    ALB 2.9 04/02/2014    PHOS 2.9 04/03/2014    MG 2.2 04/03/2014    A1C 8.2 03/29/2014   (POC blood sugars 173,131,97,145)  Nutritionally relevant meds:insulin,warfarin,senna, omega 3-acid ethyl   Allergies/intolerances:none   Education needs identified or requested:none   Pt nutrition care level: Level 1    Plan: Will provide snacks as approved per Registered Dietitian.  Will continue to provide basic nutrition services and reclassify nutrition status per policy.  Pt status information relayed to Registered Dietitian.    Charleen KirksErica Hurt, DTR

## 2014-04-04 NOTE — Progress Notes (Signed)
PCCM Fellow Progress Note     LOS: 7 days      ID  48 year old male patient with history DM, HTN, CKD who is in ICU after PTE on 03/29/14. Surgery without complications.     INTERVAL EVENTS   -Tele showed no A fib episodes, 5 beat of v tach   -Pain around surgical incision is controlled with current pain meds.  -Last BM 2 days ago, given senokot last night  -No more hypoglycemic episodes last night( highest blood sugar 206, but mainly in low 100s)  -Heparin increased to 1945  -Used home CPAP last night     I/O: negative 1.4L, CT 304, Urine 2.9      OBJECTIVE   Drips   dextrose-sodium chloride 5%-0.45% Stopped (03/31/14 1200)    heparin 25,000 units/500 mL 1,945 Units/hr (04/04/14 0857)        Medications   carvedilol  25 mg BID WC    ceFUROXime (ZINACEF) IVPB  1,500 mg Q12H NR    docusate sodium  250 mg BID    fenofibrate  145 mg Daily    insulin glargine  40 Units Q12H    insulin lispro  1-10 Units 4x Daily WC    insulin lispro  25 Units TID WC    levothyroxine  75 mcg QAM AC    omega-3 acid ethyl esters  2 g BID    prazosin  1 mg Q12H    rosuvastatin  20 mg QPM    senna  2 tablet HS    warfarin  5 mg QPM       Vital Signs  Temp  Avg: 98.8 F (37.1 C)  Min: 97.7 F (36.5 C)  Max: 100.1 F (37.8 C)  Pulse  Avg: 71  Min: 65  Max: 75  BP  Min: 141/59  Max: 151/61  No Data Recorded  Resp  Avg: 18.3  Min: 18  Max: 20  SpO2  Avg: 100 %  Min: 100 %  Max: 100 %        Date 03/30/14 0700 - 03/31/14 0659   Shift 0700-1459 1500-2259 2300-0659 24 Hour Total   I  N  T  A  K  E   I.V. 431.7   431.7    NG/GT 0   0    Shift Total 431.7   431.7   O  U  T  P  U  T   Urine 635   635    Emesis/NG Output 50   50    Chest Tube 90   90    Shift Total 775   775   Weight (kg) 111.2 111.2 111.2 111.2         Lines and tubes  Active PICC Line / CVC Line / PIV Line / Drain / Airway / Intraosseous Line / Epidural Line / ART Line / Line Type / Wound     Name: Placement date: Placement time: Site: Days:    Peripheral IV - 18 G  Left Hand 03/29/14  0545  Hand  6    Peripheral IV - 20 G Right Hand 04/01/14  0001  Hand  3    Chest Tube -  Anterior Mediastinal (x3) 03/29/14  1200  Mediastinal (x3)  5    Incision -  03/29/14 1500 Sternum Anterior;Mid 03/29/14  1500   5          Physical Exam  Gen: NAD, conversing comfortably.  Eyes: EOMI,  PERL, anicteric  HENT: Atraumatic, normocephalic, clear oropharynx  Neck: No JVD, trachea midline, no lymphadenopathy, supple  CV: RRR with rubs from chest tubes, No M/R/G/S3. Chest tubes in place, cordis removed  Resp: basilar crackles, no intercostal retractions/normal effort; sternotomy wound with Provena in place  Abdo: NTND, soft, no masses, obese.  Ext: Trace to no edema, no clubbing or cyanosis; no joint abnormalities  Neuro: No focal weakness or sensory deficit, CN grossly intact. Mild numbness of R hand index, middle fingers and thumb  Pysch: Appropriate affect and mood, good insight. AnOx3  Skin: Normal temperature, no rash or nodules    Labs  Recent Labs      04/01/14   1330  04/02/14   0230  04/02/14   1930  04/03/14   0624   NA  139  140   --   140   K  4.2  4.0   --   4.5   CL  100  103   --   101   BICARB  25  25   --   27   BUN  44*  45*   --   38*   CREAT  2.01*  1.82*   --   1.68*   CA  8.7  8.2*   --   8.8   MG  2.1  2.1   --   2.2   PHOS  3.0  3.3  2.9  2.9   TP  6.5  5.8*   --    --    ALB  3.5  2.9*   --    --    TBILI  0.30  0.24   --    --    AST  46*  35   --    --    ALT  39  32   --    --    ALK  36*  29*   --    --        Recent Labs      04/01/14   1330  04/02/14   0230   WBC  10.2*  8.7   HGB  9.3*  8.5*   HCT  28.6*  26.0*   MCV  87.2  86.7   PLT  209  205       Recent Labs      04/02/14   1930  04/03/14   0624  04/04/14   0541   PTT  55.0*  74.1*  61.6*   INR  1.1  1.2  1.2     Imaging  CXR - Lines and tube are in place    ASSESSMENT AND PLAN  Edwin Sandoval is a 48 year old male patient with history DM, HTN, CKD who is in ICU after PTE on 03/29/14. Good HD results after  surgery. 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     1-CTEPH s/P PTE on 10/29, POD 6 with good HD results. Pre OP on Ad[place] and macitentan,  RHC PAP 53/19/30, CO/CI 6.7/2.7, PCWP 20, PVR 101.  HD post OP after extubation, off pressors and sedation: CVP 12, PAP 32/14/22, PCWP 10, CO/CI 7.3/3, SVR 871, PVR 132  2- Hypoxic respiratory failure post PTE. Extubated POD1   3-A fib post OR, converted to sinus spontaneously. Has been in sinus rhythm for the last 48 hours. Still in sinus rhythm.   4-CKD: Baseline creatinine around 2, improved after   surgery. History of wilms tumor s/p nephrectomy  5-Type II DM. Better controlled today.    6-OSA on 15 cm water CPAP at night  7-Hx of hypertension, HLP and hypothyroidism    Plan   -Continue heparin gtt ( goal PTT 66-105) bridging to warfarin( home dose warfarin 2.5 alternating with 3 mg daily). Give 6 mg today.   -D/C chest tube as most of the output coming from pleural drain as well as serous fluid. D/C antibiotics after CT removal.  -Out of bed and ambulation. IS as tolerated.    -Continue lantus 40 BID and pre prandial insulin 25 TID.   -Pain control with opioids   -Continue synthroid  -Continue Coreg and anti hyperlipidemia meds   -CPAP at night  -Full code/Heparin      This patient was seen and discussed with Dr.Rushil Kimbrell     Madie Ren[facility] PCCM fellow     Pulmonary Vascular Attending Addendum      Patient seen and examined with Dr. Darral Dash.      Doing well; active, though in bed during rounds; no new issues; glucose control better      Afebrile; BP 932T-557D systolic; HR 22G, O2 sat 254% 3 liters      Lungs clear with better aeration lung bases      Soft pericardial squeaks; no S3      Sternal dressing intact, dry      No LE edema    CT output 300 cc yesterday, but filled with fluid this AM; serous  INR 1.2, PTT 61.6 on 1945 units heparin /hr    CXR: no infiltrates; small lung volumes; ? Left mediastinal-pleural CT; no significant increase in cardiac  silhouette    Post PTE with good pulmonary HD result; CKD; persistent mediastinal (? pleural) drainage       Have discussed CT management with CT surgery; to clamp CTs with follow up CXRs to assess degree of fluid accumulation; if CXRs stable in AM, OK to remove CTs       Advance coumadin AC       Postop studies once CTs removed    Talmage Coin. Lisanne Ponce, MD

## 2014-04-05 LAB — BASIC METABOLIC PANEL, BLOOD
Anion Gap: 15 mmol/L (ref 7–15)
BUN: 42 mg/dL — ABNORMAL HIGH (ref 6–20)
Bicarbonate: 23 mmol/L (ref 22–29)
Calcium: 9 mg/dL (ref 8.5–10.6)
Chloride: 100 mmol/L (ref 98–107)
Creatinine: 1.8 mg/dL — ABNORMAL HIGH (ref 0.67–1.17)
GFR: 40 mL/min
Glucose: 143 mg/dL — ABNORMAL HIGH (ref 70–115)
Potassium: 4.8 mmol/L (ref 3.5–5.1)
Sodium: 138 mmol/L (ref 136–145)

## 2014-04-05 LAB — GLUCOSE (POCT)
Glucose (POCT): 159 mg/dL — ABNORMAL HIGH (ref 70–115)
Glucose (POCT): 183 mg/dL — ABNORMAL HIGH (ref 70–115)
Glucose (POCT): 89 mg/dL (ref 70–115)
Glucose (POCT): 108 mg/dL (ref 70–115)

## 2014-04-05 LAB — HEMOGRAM, BLOOD
Hct: 29.3 % — ABNORMAL LOW (ref 40.0–50.0)
Hgb: 9.4 gm/dL — ABNORMAL LOW (ref 13.7–17.5)
MCH: 28.1 pg (ref 26.0–32.0)
MCHC: 32.1 % (ref 32.0–36.0)
MCV: 87.7 um3 (ref 79.0–95.0)
MPV: 10.7 fL (ref 9.4–12.4)
Plt Count: 390 10*3/uL — ABNORMAL HIGH (ref 140–370)
RBC: 3.34 10*6/uL — ABNORMAL LOW (ref 4.60–6.10)
RDW: 16 % — ABNORMAL HIGH (ref 12.0–14.0)
WBC: 11.1 10*3/uL — ABNORMAL HIGH (ref 4.0–10.0)

## 2014-04-05 LAB — PROTHROMBIN TIME, BLOOD
INR: 1.4
PT,Patient: 15.1 s — ABNORMAL HIGH (ref 9.7–12.5)

## 2014-04-05 LAB — EMMI, DIABETES: CARBOHYDRATE COUNTING: EMMI Video Order Number: 14996849411

## 2014-04-05 LAB — APTT, BLOOD
PTT: 71.3 s — ABNORMAL HIGH (ref 25.0–34.0)
PTT: 84.7 s (ref 25.0–34.0)

## 2014-04-05 MED ORDER — INSULIN LISPRO (HUMAN) 100 UNIT/ML SC SOLN (CUSTOM)
22.0000 [IU] | Freq: Three times a day (TID) | INTRAMUSCULAR | Status: DC
Start: 2014-04-05 — End: 2014-04-09
  Administered 2014-04-05 – 2014-04-09 (×11): 22 [IU] via SUBCUTANEOUS
  Filled 2014-04-05 (×11): qty 22

## 2014-04-05 MED ORDER — METOPROLOL TARTRATE 25 MG OR TABS
25.0000 mg | ORAL_TABLET | Freq: Two times a day (BID) | ORAL | Status: DC
Start: 2014-04-05 — End: 2014-04-11
  Administered 2014-04-05 – 2014-04-11 (×13): 25 mg via ORAL
  Filled 2014-04-05 (×13): qty 1

## 2014-04-05 NOTE — Progress Notes (Addendum)
PTE Progress note  Current Hospital  LOS: 8 days     Subjective: Feels well.  Walked four times yesterday, five laps each time, mild SOB with walking.  IS to 1500.  Regular BM yesterday.  Some incisional pain, relieved with prn oxycodone.    Tele:  Converted to atrial fib last evening at 8:30, vent rates 70s    Vitals:    Latest Entry  Range (last 24 hours)    Temperature: 99 F (37.2 C)  Temp  Avg: 98.7 F (37.1 C)  Min: 97.6 F (36.4 C)  Max: 99.8 F (37.7 C)    Blood pressure (BP): 117/69 mmHg  BP  Min: 117/69  Max: 128/69    Heart Rate: 72  Pulse  Avg: 73.3  Min: 67  Max: 78    Respirations: 20  Resp  Avg: 18  Min: 16  Max: 20    SpO2: 100 %  SpO2  Avg: 97.4 %  Min: 91 %  Max: 100 %     Weight - scale: 121 kg (266 lb 12.1 oz)  Percentage Weight Change (%): -0.58 %       Intake/Output Summary (Last 24 hours) at 04/05/14 1354  Last data filed at 04/05/14 1914   Gross per 24 hour   Intake   1000 ml   Output   2620 ml   Net  -1620 ml   CT output:  220 ml/ 24 hours (no air leak)    Exam:   Gen: NAD, comfortable in bed  HEENT: EOMI, PERRLA, OP without lesions  NECK: Supple, no cervical LAD  CV: RRR, no MRCG  RESP: CTAB, no wheezes, bibasilar crackles  ABD: soft, NT/ND, NABS  EXT: Trace LE edema  NEURO: non-focal, grossly intact  SKIN:  Sternal incision clean and dry, no erythema, Provena wound vac mid sternum  Labs:   CBC  Recent Labs      04/05/14   0545   WBC  11.1*   HGB  9.4*   HCT  29.3*   PLT  390*      Chemistry  Recent Labs      04/02/14   1930  04/03/14   0624  04/05/14   0545   NA   --   140  138   K   --   4.5  4.8   CL   --   101  100   BICARB   --   27  23   BUN   --   38*  42*   CREAT   --   1.68*  1.80*   GLU   --   84  143*   [place]   --   8.8  9.0   MG   --   2.2   --    PHOS  2.9  2.9   --      No results for input(s): ALK, AST, ALT, TBILI, DBILI, ALB in the last 72 hours.     Coags  Recent Labs      04/04/14   0541   04/04/14   2335  04/05/14   0545   PT  12.8*   --    --   15.1*   PTT  61.6*   < >   84.7*  71.3*   INR  1.2   --    --   1.4    < > = values in this interval not displayed.  Medications:  Scheduled Meds   ceFUROXime (ZINACEF) IVPB  1,500 mg Q12H NR    docusate sodium  250 mg BID    fenofibrate  145 mg Daily    insulin glargine  35 Units HS    insulin glargine  40 Units QAM    insulin lispro  1-10 Units 4x Daily WC    insulin lispro  22 Units TID WC    levothyroxine  75 mcg QAM AC    metoprolol tartrate  25 mg Q12H    omega-3 acid ethyl esters  2 g BID    prazosin  1 mg Q12H    rosuvastatin  20 mg QPM    senna  2 tablet HS    warfarin  6 mg QPM     PRN Meds   acetaminophen  650 mg Q4H PRN    dextrose  12.5 g PRN    glucagon  1 mg Once PRN    glucose  4 tablet PRN    glucose  1 Tube PRN    morphine  2 mg Q4H PRN    nalOXone  0.1 mg Q2 Min PRN    ondansetron (ZOFRAN) IVPB  8 mg Q8H PRN    oxyCODONE  10 mg Q4H PRN    oxyCODONE  5 mg Q4H PRN    polyethylene glycol  17 g Daily PRN    ramelteon  8 mg Nightly PRN     IV Meds   dextrose-sodium chloride 5%-0.45% Stopped (03/31/14 1200)    heparin 25,000 units/500 mL 2,050 Units/hr (04/05/14 0843)       ASSESSMENT/PLAN:  Edwin Sandoval is a 48 year old male with PMH of childhood Wilms tumor, s/p nephrectomy, DM-II, hypothyroidism, HTN, OSA, CRI (baseline Cr 2.47) who underwent PTE 03/28/14.    1.  S/p PTE, POD # 7 with good hemodynamic results (final hemodynamic result:  CVP 12, PA 32/14 (22), CO 7.3 (3.0), PVR 132.    --Cont chest tubes/ antibiotics until CTs out.   --Cont Coumadin 6 mg, heparin infusion bridge, INR goal 2.5-3.5.    2.  Paroxsymal atrial fibrillation, change Coreg to Lopressor 25 mg bid.  Consider po amiodarone tomorrow if remains in atrial fib.    3.  DM II, HgA1C 8.2.  Appreciate endocrinology assistance.  Cont lantus insulin 40 units AM, 35 units PM, decrease Lispro insulin 22 units with meals, cont SSI.    4.  CRI, Cr improved 1.8.    5.  HTN, monitor.   6.  Hypothyroidism, cont synthroid  7.  Dyslipidemia,  cont crestor.        Edwin Booze, NP  Pulmonary and critical care  P:  [phone]    Pulmonary Vascular Attending Addendum       Patient seen and examined with Edwin Sandoval.       New onset A fib with VR 70s; no hypotension, essentially asymptomatic; CTs placed back on suction last evening given what appeared to be a small amount of anterior mediastinal air.       Afebrile, BP 381W-299B systolic with HR 71I (A fib)       L> R bibasilar crackles       Periods of regular HR without rub, S3       Sternal dressing intact, dry       No LE edema    BUN 42, Creat 1.80, K+4.8, Na+138  WBC 11.1, Hct 29.3, Platelets 390,000  INR 1.4, PTT 71.3 (on 2050u/hr IV heparin)  CT drainage still serous, down to 220 cc yesterday    Post PTE with good pulmonary HD result; CKD; persistent mediastinal (? pleural) drainage; new onset A Fib-flutter         To begin metoprolol at this point (discontinue coreg); rate control not an issue, but would like to get in sinus rhythm if possible; amiodarone po if not back in SR tomorrow          Continue CT drainage; once at level that they can be removed, clamp tubes, and repeat CXR to make certain there's no significant air accumulation          Advance coumadin AC    Edwin Sandoval. Edwin Wuest, MD

## 2014-04-05 NOTE — Progress Notes (Addendum)
Daily Progress Note:  04/05/2014     Current Hospital Stay:   8 days - Admitted on: 03/28/2014    Subjective:  POD#7 s/p PTE    Sitting up in chair comfortably. No complaints. CT unclamped yesterday afternoon. Went into rate controlled AF overnight    Objective:  tmax 99.8. Vss. o2sat 97%3LNC    Vital Signs:  Temperature:  [97.6 F (36.4 C)-99.8 F (37.7 C)] 97.6 F (36.4 C) (11/05 0829)  Blood pressure (BP): (120-155)/(53-69) 128/69 mmHg (11/05 0829)  Heart Rate:  [67-78] 72 (11/05 0829)  Respirations:  [16-18] 18 (11/05 0829)  Pain Score:  [-] 0 (11/05 0930)  O2 Device:  [-] Nasal cannula (11/05 0851)  O2 Flow Rate (L/min):  [3 l/min] 3 l/min (11/05 0851)  SpO2:  [91 %-100 %] 97 % (11/05 0851)    Wt Readings from Last 1 Encounters:   04/05/14 121 kg (266 lb 12.1 oz)       Intake/Output (Current Shift):    Intake/Output Summary (Last 24 hours) at 04/05/14 1031  Last data filed at 04/05/14 0838   Gross per 24 hour   Intake   1360 ml   Output   3120 ml   Net  -1760 ml   CT: 220cc serous on suction    Current Facility-Administered Medications   Medication    acetaminophen (TYLENOL) solution 650 mg    carvedilol (COREG) tablet 25 mg    ceFUROXime (ZINACEF) 1,500 mg in sodium chloride 0.9 % 50 mL IVPB    dextrose 50 % solution 12.5 g    dextrose-sodium chloride 5%-0.45% infusion    docusate sodium (COLACE) capsule 250 mg    fenofibrate (TRICOR) tablet 145 mg    glucagon (GLUCAGON) injection 1 mg    glucose chewable tablet 16 g    glucose oral gel 1 Tube    heparin 25,000 units/500 mL infusion (50 units/mL) - PREMIX IN 1/2 NS    insulin glargine (LANTUS) injection 35 Units    insulin glargine (LANTUS) injection 40 Units    insulin lispro (HUMALOG) injection 1-10 Units    insulin lispro (HUMALOG) injection 25 Units    levothyroxine (SYNTHROID) tablet 75 mcg    morphine injection 2 mg    nalOXone (NARCAN) injection 0.1 mg    omega-3 acid ethyl esters (LOVAZA) capsule 2 g    ondansetron (ZOFRAN) 8 mg  in sodium chloride 0.9 % 50 mL IVPB    oxyCODONE (ROXICODONE) tablet 10 mg    oxyCODONE (ROXICODONE) tablet 5 mg    polyethylene glycol (MIRALAX) packet 17 g    prazosin (MINIPRESS) capsule 1 mg    ramelteon (ROZEREM) tablet 8 mg    rosuvastatin (CRESTOR) tablet 20 mg    senna (SENOKOT) tablet 17.2 mg    warfarin (COUMADIN) tablet 6 mg       Physical Exam:  General Appearance: healthy, alert, no distress, pleasant affect, cooperative, skin warm, dry, and pink.  Skin:  negative.    Laboratory data:   Lab Results   Component Value Date    NA 138 04/05/2014    K 4.8 04/05/2014    CL 100 04/05/2014    BICARB 23 04/05/2014    BUN 42* 04/05/2014    CREAT 1.80* 04/05/2014    GLU 143* 04/05/2014    [place] 9.0 04/05/2014     Lab Results   Component Value Date    WBC 11.1* 04/05/2014    HGB 9.4* 04/05/2014    HCT 29.3*  04/05/2014    PLT 390* 04/05/2014     No results found for: AST, ALT, ALK, TBILI, DBILI, TP, ALB  Lab Results   Component Value Date    INR 1.4 04/05/2014    PTT 71.3* 04/05/2014       Assessment and Plan:  POD#7 s/p PTE    -anticoagulation with heparin gtt, Coumadin. INR today 1.4  -DM2: on insulins  -hypothyroidism: on synthroid  -stage 3 CKD: Cr 1.8. UOP 4L  -AF: rate controlled. BB   -CTs with 220cc/24hrs serous drainage, unclamped and placed back on suction yesterday afternoon. CXR today pending    Malachy Moan, MS, PA-C  Cardiothoracic Surgery  Pager [phone]      Patient seen and examined  Agree with above findings and plan

## 2014-04-05 NOTE — Plan of Care (Signed)
Pt converted to afib, controlled rate in the 70's.  Pt is asymptomatic.  MD notified.

## 2014-04-05 NOTE — Plan of Care (Signed)
Problem: Tissue Perfusion, Cardiopulmonary - Altered  Goal: Early recognition of deterioration  Outcome: Met  Pt on telemetry monitoring, HR SR, converted to afib 70's, bp 124/62.  MD notified.  Pt asymptomatic.  Will continue to monitor pt.

## 2014-04-05 NOTE — Interdisciplinary (Signed)
Report given to Tara, RN.

## 2014-04-05 NOTE — Plan of Care (Signed)
Problem: Tissue Perfusion, Cardiopulmonary - Altered  Goal: Early recognition of deterioration  Outcome: Met  Assess per unit policy standard and procedures. Pt reminded to notify RN with chest pain, pressure, and palpitations.   Patient is on continuous telemetry monitoring. Afib, .Temperature:  [97.6 F (36.4 C)-99.8 F (37.7 C)] 99 F (37.2 C) (11/05 1110)  Blood pressure (BP): (117-128)/(53-69) 117/69 mmHg (11/05 1110)  Heart Rate:  [67-78] 72 (11/05 1110)  Respirations:  [16-20] 20 (11/05 1110)  Pain Score:  [-] 5 (11/05 1326)  O2 Device:  [-] Nasal cannula (11/05 1110)  O2 Flow Rate (L/min):  [3 l/min] 3 l/min (11/05 1110)  SpO2:  [91 %-100 %] 100 % (11/05 1110). No ectopies noted. No edema noted. All pulses palpable. Skin is warm and dry. Patient denies chest pain, pressure, and palpitations. .  Lab Results   Component Value Date     NA 138 04/05/2014     K 4.8 04/05/2014     CL 100 04/05/2014     BICARB 23 04/05/2014     BUN 42 04/05/2014     CREAT 1.80 04/05/2014     GLU 143 04/05/2014     [place] 9.0 04/05/2014   Will continue to monitor.         Problem: Falls, Risk of  Goal: Keep patient free from falls utilizing universal fall precautions  Outcome: Met  Assess fall risk Q4h and prn. Call light is within reach. Bed rails up X2. Bed is in lowest position and locked. Non-skid footware in place. Pt is made aware to call nurse for assistance as needed. Patient remains free from falls. Pt is A&OX3, verbalizes an understanding to call nurse for assistance as needed.         Problem: Alteration in Blood Glucose  Goal: Glucose level within specified parameters  Outcome: Met  I: Assess and monitor blood sugar and administer sliding scale insulin as ordered. Educate patient on signs and symptoms of hypoglycemia/hyperglycemia.   EO: Blood glucose was monitored and managed. Patient verbalized understanding of blood glucose monitoring. No signs and symptoms of hypoglycemia or hyperglycemia at this time.        Problem:  Discharge Planning  Goal: Participation in care planning  Outcome: Not Met  Awaiting discharge orders.     Problem: Pain - Acute  Goal: Communication of presence of pain  Outcome: Met  Pt is A&OX3. Pt c/o pain 6/10. Pt ordered Oxycodone , please see eMAR for time and dose of administration. Upon follow-up assessment pt states pain is 0/10 . Pt verbalizes an understanding to call nurse for pain meds as needed. Will continue to monitor pain level.         Problem: Bleeding, Risk of  Goal: Absence of active bleeding  Outcome: Met  Pt remains free from s/s of active bleeding. Pt is A&OX3, .Temperature:  [97.6 F (36.4 C)-99.8 F (37.7 C)] 99 F (37.2 C) (11/05 1110)  Blood pressure (BP): (117-128)/(53-69) 117/69 mmHg (11/05 1110)  Heart Rate:  [67-78] 72 (11/05 1110)  Respirations:  [16-20] 20 (11/05 1110)  Pain Score:  [-] 5 (11/05 1326)  O2 Device:  [-] Nasal cannula (11/05 1110)  O2 Flow Rate (L/min):  [3 l/min] 3 l/min (11/05 1110)  SpO2:  [91 %-100 %] 100 % (11/05 1110) . Peripheral IV's free from drainage. Incision with wound vac in place..   Pt with chest tube 3:1 to continuous wall suction with serous drainage. Total output recorded in epic.  Problem: Breathing Pattern - Ineffective  Goal: Respiratory rate, rhythm and depth return to baseline  Outcome: Met   Assess respiratory status Q4h and prn.  Pt is currently on 3 liters NC with oxygen saturation of 99%. Lung sounds are clear. Respiratory rate is even and non-labored. Patient denies cough and shortness of breath at this time. No respiratory distress noted. Will continue to monitor.         Problem: Skin Integrity- Intact patient high risk for impaired skin integrity Braden scale less than or equal to 18  Goal: Skin remains intact  Outcome: Met  Skin is clean and dry. Pt up to chair to restroom with one person assist. Pt able to turn self freely in bed. Wound vac in place. Chest tube to suction, dressing clean, dry, intact. Will continue to monitor.

## 2014-04-05 NOTE — Progress Notes (Signed)
Diabetes/Endocrine Progress Note    Overnight: no acute events. BGs within range; had a 76 mg/dL reading yesterday ac dinner; he was walking at the time. Fasting at 159mg /dL this AM b/c pt had crackers before BG check    Subjective: sitting up in chair.Reported 7# wt loss since admission. Eating 100% of meals.    Objective:  Temperature:  [97.6 F (36.4 C)-99 F (37.2 C)] 98.2 F (36.8 C) (11/05 1542)  Blood pressure (BP): (117-128)/(53-69) 122/63 mmHg (11/05 1542)  Heart Rate:  [72-81] 81 (11/05 1542)  Respirations:  [16-20] 18 (11/05 1542)  Pain Score:  [-] 2 (11/05 1542)  O2 Device:  [-] Nasal cannula (11/05 1542)  O2 Flow Rate (L/min):  [3 l/min] 3 l/min (11/05 1110)  SpO2:  [91 %-100 %] 100 % (11/05 1542)    GEN: NAD  Resp: unlabored    Diet: carb limited    Blood Sugars reviewed:                            AM      Marinda ElkLu        Di         HS       O/N  11/1   115  91  68/114 168/53  Lantus  60    60  Lispro   40  40  40  3    11/2   114 137 137 206  Lantus  40   40  Lispro   12 25 9     11/03  90 173 112 131  Lantus  40   40  Lispro  25 25+3 25    11/04  97/145   97 76 137     Lantus  40   35  Lispro  25 25 25     11/05  159 183  Lantus  40  Lispro  25+4 25+3    A/P: Edwin Sandoval is a 48 year old male 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 Pt and wife engaged in making healthy choices and watching carb intake.  BGs within range; had a 76 mg/dL reading yesterday ac dinner; he was walking at the time. Fasting at 159mg /dL this AM b/c pt had crackers before BG check. Will rec adjustments to Lispro qac meals.    Inpatient Recs:(paged to 1st call)  -decrease Lispro 25 to 22 units ac qac meals (RN to give 0, 1/2, whole dose based on % tray consumed)  -continue Lantus 40/35 units BID  -continue lispro high dose correction scale achs    Outpatient Recs:  -Resume oupt insulin regimen; doses TBD pending BG trends during hospital stay; might  be able to use U-100  -Follow-up with PCP within 1-2 weeks of d/c    Education:    1. Activity - pt walking daily in hallways; had a BG reading of 76mg /dL yesterday while walking. Educated about activity and BG lowering    Thank you for this interesting consult, will cont to follow    Will cont to follow. Please page [phone] with questions

## 2014-04-05 NOTE — Plan of Care (Signed)
Problem: Falls, Risk of  Goal: Keep patient free from falls utilizing universal fall precautions  Outcome: Met  Pt free from falls, fall risk assessed, environmental safety assessed, pt uses call light appropriately for assistance, bed on low, bed alarm is on and fall identifiers are in place. Pt wife also in room and assists pt.  Will continue to monitor pt and reassess.    Problem: Alteration in Blood Glucose  Goal: Glucose level within specified parameters  Outcome: Met  No insulin coverage required.  Pt given 35 units lantus sq.  Will continue to monitor for s/sx hyper/hypoglycemia and will treat as per MD orders.    Problem: Discharge Planning  Goal: Participation in care planning  Outcome: Met  Pt is cooperative and willing to participate in own health management in hospital.  Pt family also very supportive and partake in pt care, encouraging pt to become more independent.  No further orders for d/c order by MD at this time.     Problem: Pain - Acute  Goal: Communication of presence of pain  Outcome: Met  Pt able to use numerical pain scale to indicate level of pain.  Incisional pain treated with oxycodone 5mg po and pt is assessed Q 4 hrs and prn.    Problem: Bleeding, Risk of  Goal: Absence of active bleeding  Outcome: Met  Pt free from bleeding at this time, ptt= 84.7, next ptt to be drawn in 6hrs from 2330 vitals remain stable, dressings remain dry and intact, heparin gtt @ 2050 units/hr.  Will continue to monitor pt.    Problem: Breathing Pattern - Ineffective  Goal: Respiratory rate, rhythm and depth return to baseline  Outcome: Met  Pt on 3L nc, Cpap at HS.  Lungs are clear.  No respiratory distress noted at this time.  Will continue to monitor pt.    Problem: Skin Integrity- Intact patient high risk for impaired skin integrity Braden scale less than or equal to 18  Goal: Skin remains intact  Outcome: Met  Pt skin remains intact, surgical sites remain dry, no drainage noted.  Will continue to monitor  pt.

## 2014-04-05 NOTE — Plan of Care (Signed)
Pt converted back to SR from afib, rate 80's.

## 2014-04-06 LAB — GLUCOSE (POCT)
Glucose (POCT): 108 mg/dL (ref 70–115)
Glucose (POCT): 183 mg/dL — ABNORMAL HIGH (ref 70–115)
Glucose (POCT): 112 mg/dL (ref 70–115)
Glucose (POCT): 136 mg/dL — ABNORMAL HIGH (ref 70–115)

## 2014-04-06 LAB — PROTHROMBIN TIME, BLOOD
INR: 1.6
PT,Patient: 17.6 s — ABNORMAL HIGH (ref 9.7–12.5)

## 2014-04-06 LAB — APTT, BLOOD: PTT: 93.8 s (ref 25.0–34.0)

## 2014-04-06 MED ORDER — FUROSEMIDE 10 MG/ML IJ SOLN
20.00 mg | Freq: Once | INTRAMUSCULAR | Status: AC
Start: 2014-04-06 — End: 2014-04-06
  Administered 2014-04-06: 20 mg via INTRAVENOUS
  Filled 2014-04-06: qty 2

## 2014-04-06 MED ORDER — FUROSEMIDE 40 MG OR TABS
20.0000 mg | ORAL_TABLET | Freq: Every day | ORAL | Status: DC
Start: 2014-04-07 — End: 2014-04-08
  Administered 2014-04-07 – 2014-04-08 (×2): 20 mg via ORAL
  Filled 2014-04-06 (×2): qty 1

## 2014-04-06 NOTE — Progress Notes (Addendum)
Daily Progress Note:  04/06/2014     Current Hospital Stay:   9 days - Admitted on: 03/28/2014    Subjective:  POD#8 s/p PTE    Sitting up in chair comfortably. Converted to sinus overnight. CT placed to water seal overnight    Objective:  tmax 99.1. Sinus 70s. o2 sat 99%3LNC    Vital Signs:  Temperature:  [98.2 F (36.8 C)-99.1 F (37.3 C)] 99 F (37.2 C) (11/06 0731)  Blood pressure (BP): (117-146)/(63-72) 131/69 mmHg (11/06 0731)  Heart Rate:  [64-81] 69 (11/06 0731)  Respirations:  [16-20] 18 (11/06 0731)  Pain Score:  [-] 0 (11/06 0731)  O2 Device:  [-] Nasal cannula (11/06 0731)  O2 Flow Rate (L/min):  [3 l/min] 3 l/min (11/06 0731)  SpO2:  [95 %-100 %] 99 % (11/06 0731)    Wt Readings from Last 1 Encounters:   04/06/14 122 kg (268 lb 15.4 oz)       Intake/Output (Current Shift):      Intake/Output Summary (Last 24 hours) at 04/06/14 1023  Last data filed at 04/06/14 0500   Gross per 24 hour   Intake 1573.18 ml   Output    790 ml   Net 783.18 ml   CT: 290cc serosanguinous on water seal    Current Facility-Administered Medications   Medication    acetaminophen (TYLENOL) solution 650 mg    ceFUROXime (ZINACEF) 1,500 mg in sodium chloride 0.9 % 50 mL IVPB    dextrose 50 % solution 12.5 g    dextrose-sodium chloride 5%-0.45% infusion    docusate sodium (COLACE) capsule 250 mg    fenofibrate (TRICOR) tablet 145 mg    [START ON 04/07/2014] furosemide (LASIX) tablet 20 mg    glucagon (GLUCAGON) injection 1 mg    glucose chewable tablet 16 g    glucose oral gel 1 Tube    heparin 25,000 units/500 mL infusion (50 units/mL) - PREMIX IN 1/2 NS    insulin glargine (LANTUS) injection 35 Units    insulin glargine (LANTUS) injection 40 Units    insulin lispro (HUMALOG) injection 1-10 Units    insulin lispro (HUMALOG) injection 22 Units    levothyroxine (SYNTHROID) tablet 75 mcg    metoprolol tartrate (LOPRESSOR) tablet 25 mg    morphine injection 2 mg    nalOXone (NARCAN) injection 0.1 mg    omega-3 acid  ethyl esters (LOVAZA) capsule 2 g    ondansetron (ZOFRAN) 8 mg in sodium chloride 0.9 % 50 mL IVPB    oxyCODONE (ROXICODONE) tablet 10 mg    oxyCODONE (ROXICODONE) tablet 5 mg    polyethylene glycol (MIRALAX) packet 17 g    prazosin (MINIPRESS) capsule 1 mg    ramelteon (ROZEREM) tablet 8 mg    rosuvastatin (CRESTOR) tablet 20 mg    senna (SENOKOT) tablet 17.2 mg    warfarin (COUMADIN) tablet 6 mg       Physical Exam:  General Appearance: healthy, alert, no distress, pleasant affect, cooperative, skin warm, dry, and pink.  Skin:  negative.    Laboratory data:   Lab Results   Component Value Date    NA 138 04/05/2014    K 4.8 04/05/2014    CL 100 04/05/2014    BICARB 23 04/05/2014    BUN 42* 04/05/2014    CREAT 1.80* 04/05/2014    GLU 143* 04/05/2014    [place] 9.0 04/05/2014     Lab Results   Component Value Date    WBC  11.1* 04/05/2014    HGB 9.4* 04/05/2014    HCT 29.3* 04/05/2014    PLT 390* 04/05/2014     No results found for: AST, ALT, ALK, TBILI, DBILI, TP, ALB  Lab Results   Component Value Date    INR 1.6 04/06/2014    PTT 93.8* 04/06/2014       Assessment and Plan:  POD#8 s/p PTE    -anticoagulation with heparin gtt, Coumadin. INR today 1.6  -DM2: on insulins  -hypothyroidism: on synthroid  -stage 3 CKD: Cr 1.8  -AF: rate controlled. BB   -CTs with 290cc/24hrs serous drainage, on water seal    Malachy Moan, MS, PA-C  Cardiothoracic Surgery  Pager [phone]    Continued chest tube output, otherwise recovering well.    Maryln Gottron MD

## 2014-04-06 NOTE — Progress Notes (Addendum)
PTE Progress note  Current Hospital  LOS: 9 days     Subjective: Feels well.  Walked three times yesterday, 5 short laps with each walk.  Incisional pain controlled with oxycodone.  IS to 1500.  Os at 3 liters.      Tele:  Converted from atrial fib (vent rates 80s)  last evening at 8:30.      Vitals:    Latest Entry  Range (last 24 hours)    Temperature: 99 F (37.2 C)  Temp  Avg: 98.7 F (37.1 C)  Min: 98.2 F (36.8 C)  Max: 99.1 F (37.3 C)    Blood pressure (BP): 131/69 mmHg  BP  Min: 117/69  Max: 146/65    Heart Rate: 69  Pulse  Avg: 71  Min: 64  Max: 81    Respirations: 18  Resp  Avg: 17.7  Min: 16  Max: 20    SpO2: 99 %  SpO2  Avg: 98.6 %  Min: 95 %  Max: 100 %     Weight - scale: 122 kg (268 lb 15.4 oz)  Percentage Weight Change (%): 0.83 %       Intake/Output Summary (Last 24 hours) at 04/06/14 0935  Last data filed at 04/06/14 0500   Gross per 24 hour   Intake 1573.18 ml   Output    790 ml   Net 783.18 ml   CT output:  290 ml/ 24 hours (no air leak)     Exam:   Gen: NAD, comfortable in bed  HEENT: EOMI, PERRLA, OP without lesions  NECK: Supple, no cervical LAD  CV: RRR, no MRCG  RESP: CTAB, no wheezes, bibasilar crackles  ABD: soft, NT/ND, NABS  EXT: Trace LE edema  NEURO: non-focal, grossly intact  SKIN:  Sternal incision clean and dry, no erythema, Provena wound vac to mid sternum    Labs:   CBC  Recent Labs      04/05/14   0545   WBC  11.1*   HGB  9.4*   HCT  29.3*   PLT  390*      Chemistry  Recent Labs      04/05/14   0545   NA  138   K  4.8   CL  100   BICARB  23   BUN  42*   CREAT  1.80*   GLU  143*   [place]  9.0     No results for input(s): ALK, AST, ALT, TBILI, DBILI, ALB in the last 72 hours.     Coags  Recent Labs      04/05/14   0545  04/06/14   0535   PT  15.1*  17.6*   PTT  71.3*  93.8*   INR  1.4  1.6       Medications:  Scheduled Meds   ceFUROXime (ZINACEF) IVPB  1,500 mg Q12H NR    docusate sodium  250 mg BID    fenofibrate  145 mg Daily    insulin glargine  35 Units HS    insulin  glargine  40 Units QAM    insulin lispro  1-10 Units 4x Daily WC    insulin lispro  22 Units TID WC    levothyroxine  75 mcg QAM AC    metoprolol tartrate  25 mg Q12H    omega-3 acid ethyl esters  2 g BID    prazosin  1 mg Q12H    rosuvastatin  20 mg  QPM    senna  2 tablet HS    warfarin  6 mg QPM     PRN Meds   acetaminophen  650 mg Q4H PRN    dextrose  12.5 g PRN    glucagon  1 mg Once PRN    glucose  4 tablet PRN    glucose  1 Tube PRN    morphine  2 mg Q4H PRN    nalOXone  0.1 mg Q2 Min PRN    ondansetron (ZOFRAN) IVPB  8 mg Q8H PRN    oxyCODONE  10 mg Q4H PRN    oxyCODONE  5 mg Q4H PRN    polyethylene glycol  17 g Daily PRN    ramelteon  8 mg Nightly PRN     IV Meds   dextrose-sodium chloride 5%-0.45% Stopped (03/31/14 1200)    heparin 25,000 units/500 mL 2,050 Units/hr (04/06/14 0358)       ASSESSMENT/PLAN: Mr. Vora is a 48 year old male with PMH of childhood Wilms tumor, s/p nephrectomy, DM-II, hypothyroidism, HTN, OSA, CRI (baseline Cr 2.47) who underwent PTE 03/28/14.    1. S/p PTE, POD # 8 with good hemodynamic results (final hemodynamic result: CVP 12, PA 32/14 (22), CO 7.3 (3.0), PVR 132.   --Cont chest tubes (water seal per CT surgery).  Cont antibiotics until CTs out.  Start lasix 20 mg to help mobilize fluids.    --Cont Coumadin 6 mg, heparin infusion bridge, INR goal 2.5-3.5.   2. Paroxsymal atrial fibrillation, now in NSR: cont Lopressor 25 mg bid. Consider po amiodarone if remains in atrial fib.   3. DM II, HgA1C 8.2. Appreciate endocrinology assistance. Cont lantus insulin 40 units AM, 35 units PM, decrease Lispro insulin 22 units with meals, cont SSI.   4. CRI, Cr improved 1.8.   5. HTN, monitor.   6. Hypothyroidism, cont synthroid  7. Dyslipidemia, cont crestor.     Jettie Booze, NP  Pulmonary and critical care  P:  [phone]    Pulmonary Vascular Attending Addendum:  Patient was examined with Jettie Booze NP.  Labs, imaging and tests reviewed.  Please see above note for  full details and plan.    Final HD:CVP 55, PA 32/14 (22), CO 7.3 (3.0), PVR 132  48 yo M with hx of wilms tumor s/p nephrectomy with baseline CKD, DM, hypothyroidism s/p PTE 03/2914.  Progressing well psot-op other than persistent serous CT output.  Last 24 hours still 300 mL.  -Start diuretic in attempt to decrease mediastinal drain output  -Continue beta blocker for PAF and heparin gtt.  -Continue coumadin for INR 2.5-3.5  -Appreciate endo assistance with highly insulin resistant DM II.    Georgiann Mohs MD  Pager [phone]

## 2014-04-06 NOTE — Plan of Care (Signed)
Problem: Discharge Planning  Goal: Participation in care planning  Outcome: Met  Discussed plan of care, including:  medications, diet, activity, cardiac monitoring, pain management, safety, procedures, labs, and blood glucose monitoring.  Patient verbalized understanding of plan of care.  Patient encouraged to ask questions and voice concerns.

## 2014-04-06 NOTE — Plan of Care (Signed)
Problem: Falls, Risk of  Goal: Keep patient free from falls utilizing universal fall precautions  Outcome: Met  Call light bell within reach.Assisted with getting out of bed to the chair  and to the bathroom. Frequent visual check by staff performed. Non-skid socks placed on patient.Patient is compliant and free from injury.

## 2014-04-06 NOTE — Progress Notes (Signed)
Diabetes/Endocrine Progress Note    Overnight: no acute events. BGs within target; BG ac lunch today at 183, but it was post-prandial    Subjective: wife present; patient shaving    Objective:  Temperature:  [97.8 F (36.6 C)-99.1 F (37.3 C)] 97.8 F (36.6 C) (11/06 1536)  Blood pressure (BP): (131-146)/(61-72) 137/61 mmHg (11/06 1536)  Heart Rate:  [64-71] 71 (11/06 1536)  Respirations:  [16-18] 18 (11/06 1536)  Pain Score:  [-] 0 (11/06 1536)  O2 Device:  [-] Nasal cannula (11/06 1536)  O2 Flow Rate (L/min):  [3 l/min] 3 l/min (11/06 1536)  SpO2:  [95 %-100 %] 98 % (11/06 1536)    GEN: NAD  Resp: unlabored    Diet: carb limited    Blood Sugars reviewed:                            AM      Marinda ElkLu        Di         HS       O/N  11/1   115  91  68/114 168/53  Lantus  60    60  Lispro   40  40  40  3    11/2   114 137 137 206  Lantus  40   40  Lispro   12 25 9     11/03  90 173 112 131  Lantus  40   40  Lispro  25 25+3 25    11/04  97/145   97 76 137     Lantus  40   35  Lispro  25 25 25     11/05  159 183 89 108  Lantus  40   35  Lispro  25+3 25+4 22    11/06  108 183  Lantus    Lispro  22     A/P: Edwin Sandoval is a 48 year old male 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 BGs within target. Given good glycemic control in house and decreased TDD (total daily dose) of insulin, pt might benefit from using U-100 insulin instead of U-500 regimen prior to admission.    Inpatient Recs:  -continue 22 units ac qac meals (RN to give 0, 1/2, whole dose based on % tray consumed)  -continue Lantus 40/35 units BID  -continue lispro high dose correction scale achs    Outpatient Recs:  -Lantus 40/35 units BID  -Lispro 22 units qac meals  -Follow-up with PCP within 1-2 weeks of d/c    Will cont to follow. Please page [phone] with questions

## 2014-04-06 NOTE — Plan of Care (Signed)
Problem: Alteration in Blood Glucose  Goal: Glucose level within specified parameters  Outcome: Met  Blood glucose monitored per orders and administered sliding scale insulin as ordered.  Educated patient on signs and symptoms of hypoglycemia/hyperglycemia.  Patient verbalized understanding of blood glucose monitoring.  No signs and symptoms of hypoglycemia or hyperglycemia noted.  Will continue to monitor.

## 2014-04-06 NOTE — Plan of Care (Signed)
Problem: Skin Integrity- Intact patient high risk for impaired skin integrity Braden scale less than or equal to 18  Goal: Skin remains intact  Outcome: Met  Skin status and integrity assessed and monitored during a shift. Patient is able to ambulate in the hall  3 times. Patient is able to reposition self in the bed easily.  Chest tube to water seal is draining moderate amount of serosanguinous drainage. Dressing is clean, dry and intact. No pressure related skin lesions noted.

## 2014-04-06 NOTE — Plan of Care (Signed)
Problem: Tissue Perfusion, Cardiopulmonary - Altered  Goal: Early recognition of deterioration  Outcome: Met  Pt on telemetry monitoring, currently SR 60's, converted from afib 70's.  Bp 146/65- 136/72.  Will continue to monitor pt.    Problem: Falls, Risk of   Goal: Keep patient free from falls utilizing universal fall precautions   Outcome: Met   Pt free from falls, fall risk assessed, environmental safety assessed, pt uses call light appropriately for assistance, bed on low, bed alarm is on and fall identifiers are in place. Pt wife also in room and assists pt. Will continue to monitor pt and reassess.     Problem: Alteration in Blood Glucose   Goal: Glucose level within specified parameters   Outcome: Met   No insulin coverage required. Pt given 35 units lantus sq. Will continue to monitor for s/sx hyper/hypoglycemia and will treat as per MD orders.     Problem: Discharge Planning   Goal: Participation in care planning   Outcome: Met   Pt is cooperative and willing to participate in own health management in hospital. Pt family also very supportive and partake in pt care, encouraging pt to become more independent. No further orders for d/c order by MD at this time.     Problem: Pain - Acute   Goal: Communication of presence of pain   Outcome: Met   Pt able to use numerical pain scale to indicate level of pain. Incisional pain treated with oxycodone 35m po and pt is assessed Q 4 hrs and prn.     Problem: Bleeding, Risk of   Goal: Absence of active bleeding   Outcome: Met   Pt free from bleeding at this time, last ptt= 71.3, next ptt to be drawn in am,  vitals remain stable, dressings remain dry and intact, heparin gtt @ 2050 units/hr. Will continue to monitor pt.     Problem: Breathing Pattern - Ineffective   Goal: Respiratory rate, rhythm and depth return to baseline   Outcome: Met   Pt on 3L nc, Cpap at HS. Lungs are clear. No respiratory distress noted at this time. Will continue to monitor pt.  CT draining  serous drainage.    Problem: Skin Integrity- Intact patient high risk for impaired skin integrity Braden scale less than or equal to 18   Goal: Skin remains intact   Outcome: Met   Pt skin remains intact, surgical sites remain dry, no drainage noted. Will continue to monitor pt.

## 2014-04-06 NOTE — Plan of Care (Signed)
Problem: Pain - Acute  Goal: Communication of presence of pain  Outcome: Met  Pain level assessed and monitored during a shift.Patient was c/o pain, medicated with 5 mg Oxycodone PO with adequate pain relief.

## 2014-04-06 NOTE — Plan of Care (Signed)
Problem: Tissue Perfusion, Cardiopulmonary - Altered  Goal: Early recognition of deterioration  Outcome: Met  Assessed and monitored cardiac status q4h and prn, instructed patient to report chest pain/palpitations/pressure.  Telemetry monitored, NSR with HR 71 and BP 137/61.  Na=138, K=4.8 this morning with labs. Peripheral pulses palpable with trace edema noted.  Patient denies chest pain, pressure and palpitations. Will continue to monitor and assess for changes.

## 2014-04-06 NOTE — Plan of Care (Signed)
Problem: Breathing Pattern - Ineffective  Goal: Respiratory rate, rhythm and depth return to baseline  Outcome: Met  Monitored breathing and lung sounds. Patient advised to call and report difficulty breathing/shortness of breath. Monitor oxygen saturations. Assessed patient for signs or symptoms of hypoxia. Patient reported no dyspnea so far this shift. See documented oxygen saturation with vitals. See assessment for pulmonary details. Patient without acute respiratory distress during shift. Continue to monitor patient, intervene as necessary, and update care plan as needed during shift.

## 2014-04-07 DIAGNOSIS — J90 Pleural effusion, not elsewhere classified: Secondary | ICD-10-CM

## 2014-04-07 LAB — PROTHROMBIN TIME, BLOOD
INR: 2
PT,Patient: 20.9 s — ABNORMAL HIGH (ref 9.7–12.5)

## 2014-04-07 LAB — HEMOGRAM, BLOOD
Hct: 26.6 % — ABNORMAL LOW (ref 40.0–50.0)
Hgb: 8.4 gm/dL — ABNORMAL LOW (ref 13.7–17.5)
MCH: 27.7 pg (ref 26.0–32.0)
MCHC: 31.6 % — ABNORMAL LOW (ref 32.0–36.0)
MCV: 87.8 um3 (ref 79.0–95.0)
MPV: 9.8 fL (ref 9.4–12.4)
Plt Count: 414 10*3/uL — ABNORMAL HIGH (ref 140–370)
RBC: 3.03 10*6/uL — ABNORMAL LOW (ref 4.60–6.10)
RDW: 16.3 % — ABNORMAL HIGH (ref 12.0–14.0)
WBC: 8.7 10*3/uL (ref 4.0–10.0)

## 2014-04-07 LAB — BASIC METABOLIC PANEL, BLOOD
Anion Gap: 10 mmol/L (ref 7–15)
BUN: 39 mg/dL — ABNORMAL HIGH (ref 6–20)
Bicarbonate: 27 mmol/L (ref 22–29)
Calcium: 8.6 mg/dL (ref 8.5–10.6)
Chloride: 100 mmol/L (ref 98–107)
Creatinine: 1.76 mg/dL — ABNORMAL HIGH (ref 0.67–1.17)
GFR: 41 mL/min
Glucose: 123 mg/dL — ABNORMAL HIGH (ref 70–115)
Potassium: 4.6 mmol/L (ref 3.5–5.1)
Sodium: 137 mmol/L (ref 136–145)

## 2014-04-07 LAB — GLUCOSE (POCT)
Glucose (POCT): 118 mg/dL — ABNORMAL HIGH (ref 70–115)
Glucose (POCT): 250 mg/dL — ABNORMAL HIGH (ref 70–115)
Glucose (POCT): 128 mg/dL — ABNORMAL HIGH (ref 70–115)
Glucose (POCT): 201 mg/dL — ABNORMAL HIGH (ref 70–115)

## 2014-04-07 LAB — APTT, BLOOD: PTT: 122.4 s (ref 25.0–34.0)

## 2014-04-07 NOTE — Progress Notes (Signed)
PULMONARY/CRITICAL CARE ATTENDING NOTE  Current Hospital  LOS: 10 days     Subjective:   Feeling well.  Ambulating around unit.  Glucose well controlled.    Medications:  Scheduled Meds   ceFUROXime (ZINACEF) IVPB  1,500 mg Q12H NR    docusate sodium  250 mg BID    fenofibrate  145 mg Daily    furosemide  20 mg Daily    insulin glargine  35 Units HS    insulin glargine  40 Units QAM    insulin lispro  1-10 Units 4x Daily WC    insulin lispro  22 Units TID WC    levothyroxine  75 mcg QAM AC    metoprolol tartrate  25 mg Q12H    omega-3 acid ethyl esters  2 g BID    prazosin  1 mg Q12H    rosuvastatin  20 mg QPM    senna  2 tablet HS    warfarin  6 mg QPM     PRN Meds   acetaminophen  650 mg Q4H PRN    dextrose  12.5 g PRN    glucagon  1 mg Once PRN    glucose  4 tablet PRN    glucose  1 Tube PRN    morphine  2 mg Q4H PRN    nalOXone  0.1 mg Q2 Min PRN    ondansetron (ZOFRAN) IVPB  8 mg Q8H PRN    oxyCODONE  10 mg Q4H PRN    oxyCODONE  5 mg Q4H PRN    polyethylene glycol  17 g Daily PRN    ramelteon  8 mg Nightly PRN     IV Meds   dextrose-sodium chloride 5%-0.45% Stopped (03/31/14 1200)       Vitals:    Latest Entry  Range (last 24 hours)    Temperature: 98.4 F (36.9 C)  Temp  Avg: 98.3 F (36.8 C)  Min: 97.8 F (36.6 C)  Max: 99.1 F (37.3 C)    Blood pressure (BP): 130/64 mmHg  BP  Min: 108/56  Max: 147/63    Heart Rate: 70  Pulse  Avg: 68  Min: 62  Max: 71    Respirations: 18  Resp  Avg: 17.5  Min: 16  Max: 18    SpO2: 97 %  SpO2  Avg: 98.3 %  Min: 97 %  Max: 100 %    CVP (mmHg): 12 mmHg  No Data Recorded     Weight - scale: 120.9 kg (266 lb 8.6 oz)  Percentage Weight Change (%): -0.9 %       Intake/Output Summary (Last 24 hours) at 04/07/14 0929  Last data filed at 04/07/14 0600   Gross per 24 hour   Intake   2140 ml   Output   2741 ml   Net   -601 ml       Exam:   Gen: NAD  CV: RRR  RESP: CTAB  ABD: soft, NT/ND, NABS  EXT: No LE edema  NEURO: non-focal, grossly intact    Labs:      CBC  Recent Labs      04/05/14   0545  04/07/14   0527   WBC  11.1*  8.7   HGB  9.4*  8.4*   HCT  29.3*  26.6*   PLT  390*  414*      Chemistry  Recent Labs      04/05/14   0545  04/07/14  0527   NA  138  137   K  4.8  4.6   CL  100  100   BICARB  23  27   BUN  42*  39*   CREAT  1.80*  1.76*   GLU  143*  123*   [place]  9.0  8.6        Coags  Recent Labs      04/06/14   0535  04/07/14   0527   PT  17.6*  20.9*   PTT  93.8*  122.4*   INR  1.6  2.0       Imaging:  Stable position of lines and tubes.  Faint ? Pleural line in L lung apex suggesting possible small L PTX.    ASSESSMENT/PLAN:  48 yo M with hx of wilms tumor s/p nephrectomy with baseline CKD, DM, hypothyroidism s/p PTE 03/2914. Progressing well psot-op.  High volume serous CT output decreased significantly today.  Cr stable.    -Clamp CT and repeat XRAY in 4-6 hours.  If stable will remove CT tomorrow AM.  -Continue low dose lasix.  -Continue metoprolol 25 mg BID PAF.  -Continue coumadin 6 mg for INR 2.5-3.5  -D/C heparin gtt now that INR 2.0  -Appreciate endo assistance with highly insulin resistant DM II.  Continue lantus insulin 40 units AM, 35 units PM, decrease Lispro insulin 22 units with meals, cont SSI.     # Code Status:  Full Code    Bluford Mainavid Nader Boys M.D.  Pulmonary and Critical Care Attending  Pager [phone]

## 2014-04-07 NOTE — Plan of Care (Signed)
Problem: Discharge Planning  Goal: Participation in care planning  Outcome: Met  Discussed plan of care, including:  medications, diet, activity, cardiac monitoring, pain management, safety, procedures, labs, and blood glucose monitoring.  Patient verbalized understanding of plan of care.  Patient encouraged to ask questions and voice concerns.

## 2014-04-07 NOTE — Plan of Care (Signed)
Problem: Tissue Perfusion, Cardiopulmonary - Altered  Goal: Early recognition of deterioration  Outcome: Met  Assessed and monitored cardiac status q4h and prn, instructed patient to report chest pain/palpitations/pressure.  Telemetry monitored, NSR with HR 71 and BP 140/74.  Na=137, K=4.6 this morning with labs. Peripheral pulses palpable with trace edema noted.  Patient denies chest pain, pressure and palpitations. Will continue to monitor and assess for changes.

## 2014-04-07 NOTE — Plan of Care (Signed)
Problem: Falls, Risk of  Goal: Keep patient free from falls utilizing universal fall precautions  Outcome: Met  Call light bell within reach.Assisted with getting out of bed to the chair  and to the bathroom. Frequent visual check by staff performed. Non-skid socks placed on patient.Patient is compliant and free from injury.

## 2014-04-07 NOTE — Plan of Care (Signed)
Problem: Alteration in Blood Glucose  Goal: Glucose level within specified parameters  Outcome: Met  Blood glucose monitored per orders and administered sliding scale insulin as ordered.  Educated patient on signs and symptoms of hypoglycemia/hyperglycemia.  Patient verbalized understanding of blood glucose monitoring.  No signs and symptoms of hypoglycemia or hyperglycemia noted.  Will continue to monitor.

## 2014-04-07 NOTE — Plan of Care (Signed)
Problem: Pain - Acute  Goal: Communication of presence of pain  Outcome: Met  Pain level assessed and monitored during a shift.Patient was c/o pain, medicated with 5 mg Oxycodone PO with adequate pain relief.

## 2014-04-07 NOTE — Interdisciplinary (Signed)
Patient converted back to A-fib. Patient asymptomatic with heart rate in the 90's. Dr paged

## 2014-04-07 NOTE — Plan of Care (Signed)
Problem: Bleeding, Risk of  Goal: Absence of active bleeding  Outcome: Met  Results for Sandoval, Edwin MICHAEL (MRN [id number]) as of 04/07/2014 15:27    Ref. Range 04/07/2014 05:27   RBC Latest Range: 4.60-6.10 mill/mm3 3.03 (L)   Hgb Latest Range: 13.7-17.5 gm/dL 8.4 (L)   Hct Latest Range: 40.0-50.0 % 26.6 (L)   Vital signs are within base line. Chest tube clamped, patient denies any SOB. No evidence of bleeding noted.

## 2014-04-07 NOTE — Plan of Care (Signed)
Problem: Skin Integrity- Intact patient high risk for impaired skin integrity Braden scale less than or equal to 18  Goal: Skin remains intact  Outcome: Met  Skin status and integrity assessed and monitored during a shift. Patient is able to ambulate in the hall 3 times. Patient is able to reposition self in the bed easily. Chest tube clamped.. Dressing is clean, dry and intact. No pressure related skin lesions noted.

## 2014-04-08 DIAGNOSIS — J939 Pneumothorax, unspecified: Secondary | ICD-10-CM

## 2014-04-08 LAB — HEMOGRAM, BLOOD
Hct: 29.3 % — ABNORMAL LOW (ref 40.0–50.0)
Hgb: 9.3 gm/dL — ABNORMAL LOW (ref 13.7–17.5)
MCH: 27.8 pg (ref 26.0–32.0)
MCHC: 31.7 % — ABNORMAL LOW (ref 32.0–36.0)
MCV: 87.7 um3 (ref 79.0–95.0)
MPV: 9.7 fL (ref 9.4–12.4)
Plt Count: 440 10*3/uL — ABNORMAL HIGH (ref 140–370)
RBC: 3.34 10*6/uL — ABNORMAL LOW (ref 4.60–6.10)
RDW: 15.9 % — ABNORMAL HIGH (ref 12.0–14.0)
WBC: 8.9 10*3/uL (ref 4.0–10.0)

## 2014-04-08 LAB — MAGNESIUM, BLOOD: Magnesium: 2.1 mg/dL (ref 1.7–2.6)

## 2014-04-08 LAB — COMPREHENSIVE METABOLIC PANEL, BLOOD
ALT (SGPT): 18 U/L (ref 0–41)
AST (SGOT): 14 U/L (ref 0–40)
Albumin: 3.4 g/dL — ABNORMAL LOW (ref 3.5–5.2)
Alkaline Phos: 54 U/L (ref 40–129)
Anion Gap: 14 mmol/L (ref 7–15)
BUN: 45 mg/dL — ABNORMAL HIGH (ref 6–20)
Bicarbonate: 25 mmol/L (ref 22–29)
Bilirubin, Tot: 0.27 mg/dL (ref <1.20)
Calcium: 8.9 mg/dL (ref 8.5–10.6)
Chloride: 97 mmol/L — ABNORMAL LOW (ref 98–107)
Creatinine: 2.02 mg/dL — ABNORMAL HIGH (ref 0.67–1.17)
GFR: 35 mL/min
Glucose: 193 mg/dL — ABNORMAL HIGH (ref 70–115)
Potassium: 4.6 mmol/L (ref 3.5–5.1)
Sodium: 136 mmol/L (ref 136–145)
Total Protein: 6.9 g/dL (ref 6.0–8.0)

## 2014-04-08 LAB — PROTHROMBIN TIME, BLOOD
INR: 1.9
INR: 1.9
PT,Patient: 20.8 s — ABNORMAL HIGH (ref 9.7–12.5)
PT,Patient: 20.3 s — ABNORMAL HIGH (ref 9.7–12.5)

## 2014-04-08 LAB — APTT, BLOOD: PTT: 31.8 s (ref 25.0–34.0)

## 2014-04-08 LAB — GLUCOSE (POCT)
Glucose (POCT): 234 mg/dL — ABNORMAL HIGH (ref 70–115)
Glucose (POCT): 197 mg/dL — ABNORMAL HIGH (ref 70–115)
Glucose (POCT): 181 mg/dL — ABNORMAL HIGH (ref 70–115)
Glucose (POCT): 215 mg/dL — ABNORMAL HIGH (ref 70–115)

## 2014-04-08 MED ORDER — WARFARIN SODIUM 4 MG OR TABS
8.0000 mg | ORAL_TABLET | Freq: Every evening | ORAL | Status: DC
Start: 2014-04-08 — End: 2014-04-10
  Administered 2014-04-08 – 2014-04-09 (×2): 8 mg via ORAL
  Filled 2014-04-08 (×2): qty 2

## 2014-04-08 NOTE — Plan of Care (Signed)
Problem: Tissue Perfusion, Cardiopulmonary - Altered  Goal: Early recognition of deterioration  Outcome: Met  Assessed and monitored cardiac status q4h and prn, instructed patient to report chest pain/palpitations/pressure.  Telemetry monitored, NSR with HR72 and BP 126/61.  Na=136, K=3.6 this morning with labs. Peripheral pulses palpable with trace edema noted.  Patient denies chest pain, pressure and palpitations. Will continue to monitor and assess for changes.

## 2014-04-08 NOTE — Progress Notes (Signed)
PULMONARY/CRITICAL CARE ATTENDING NOTE  Current Hospital  LOS: 11 days     Subjective:   CT clamped yesterday without expansion of small L apical PTX.  Remained clamped overnight.  Back into rate controled afib with BBB @ 7 PM.  Asymptomatic.    Medications:  Scheduled Meds   ceFUROXime (ZINACEF) IVPB  1,500 mg Q12H NR    docusate sodium  250 mg BID    fenofibrate  145 mg Daily    furosemide  20 mg Daily    insulin glargine  35 Units HS    insulin glargine  40 Units QAM    insulin lispro  1-10 Units 4x Daily WC    insulin lispro  22 Units TID WC    levothyroxine  75 mcg QAM AC    metoprolol tartrate  25 mg Q12H    omega-3 acid ethyl esters  2 g BID    prazosin  1 mg Q12H    rosuvastatin  20 mg QPM    senna  2 tablet HS    warfarin  8 mg QPM     PRN Meds   acetaminophen  650 mg Q4H PRN    dextrose  12.5 g PRN    glucagon  1 mg Once PRN    glucose  4 tablet PRN    glucose  1 Tube PRN    morphine  2 mg Q4H PRN    nalOXone  0.1 mg Q2 Min PRN    ondansetron (ZOFRAN) IVPB  8 mg Q8H PRN    oxyCODONE  10 mg Q4H PRN    oxyCODONE  5 mg Q4H PRN    polyethylene glycol  17 g Daily PRN    ramelteon  8 mg Nightly PRN     IV Meds   dextrose-sodium chloride 5%-0.45% Stopped (03/31/14 1200)       Vitals:    Latest Entry  Range (last 24 hours)    Temperature: 98.4 F (36.9 C)  Temp  Avg: 98.8 F (37.1 C)  Min: 98.4 F (36.9 C)  Max: 99.2 F (37.3 C)    Blood pressure (BP): 135/66 mmHg  BP  Min: 116/61  Max: 151/66    Heart Rate: 78  Pulse  Avg: 79.3  Min: 68  Max: 92    Respirations: 18  Resp  Avg: 17.4  Min: 16  Max: 18    SpO2: 97 %  SpO2  Avg: 97.9 %  Min: 97 %  Max: 99 %    CVP (mmHg): 12 mmHg  No Data Recorded     Weight - scale: 118.6 kg (261 lb 7.5 oz)  Percentage Weight Change (%): -1.9 %    Vent:   No results found for: ARTPH, ARTPCO2, ARTPO2       Intake/Output Summary (Last 24 hours) at 04/08/14 0754  Last data filed at 04/08/14 0600   Gross per 24 hour   Intake   1900 ml   Output   3151 ml      Net  -1251 ml     Exam:   Gen: NAD  ZO:[id number]:Irreg Irreg  RESP: Rales at bases bilat  ABD: soft, NT/ND, NABS  EXT: trace edema bilat LE  NEURO: non-focal, grossly intact      Labs:   CBC  Recent Labs      04/07/14   0527   WBC  8.7   HGB  8.4*   HCT  26.6*   PLT  414*  Chemistry  Recent Labs      04/07/14   0527   NA  137   K  4.6   CL  100   BICARB  27   BUN  39*   CREAT  1.76*   GLU  123*   [place]  8.6        Coags  Recent Labs      04/07/14   0527  04/08/14   0525   PT  20.9*  20.8*   PTT  122.4*  31.8   INR  2.0  1.9       ASSESSMENT/PLAN:  48 yo M with hx of wilms tumor s/p nephrectomy with baseline CKD, DM, hypothyroidism s/p PTE 03/2914. Progressing well post-op. No change in PTX after CT clamped.  XRAY this AM with CT clamped overnight again stable.  No change in PTX or cardiac silhouette accounting for technique.    -Remove CT today  -Continue low dose lasix.  May be able to discontinue tomorrow.  -Continue metoprolol 25 mg BID for rate controlled afib.  Repeat metabolic panel to ensure K and Mg ok.  -Repeat INR this afternoon to ensure does not drift much lower given current afib.  If INR 1.7 or less will start heparin gtt.  -Increase coumadin to 8 mg for INR 2.5-3.5  -Appreciate endo assistance with highly insulin resistant DM II. Continue lantus insulin 40 units AM, 35 units PM, decrease Lispro insulin 22 units with meals, cont SSI.   -Post op studies - ECHO , V/Q and 12 view XRAY ordered for tomorrow.    # Code Status:  Full Code    Bluford Mainavid Omar Orrego M.D.  Pulmonary and Critical Care Attending  Pager [phone]

## 2014-04-08 NOTE — Plan of Care (Signed)
Problem: Falls, Risk of  Goal: Keep patient free from falls utilizing universal fall precautions  Outcome: Met  Patient steady on feet when ambulating. Educated on when to call for assistance to get out of bed. Patient verbalized understanding    Problem: Pain - Acute  Goal: Communication of presence of pain  Outcome: Met  Patient states pain level is at a 7 around incision site. Patient requesting and receiving pain meds around the clock. Educated on not letting pain get out of control. Patient verbalizes understanding    Problem: Bleeding, Risk of  Goal: Absence of active bleeding  Outcome: Met  No active signs of bleeding. Hemodynamically stable, chest tube site and midsternal incision site clean, dry, and, intact. Patient educated on signs of bleeding and what to report. Patient verbalizes understanding. Will continue to monitor    Problem: Breathing Pattern - Ineffective  Goal: Respiratory rate, rhythm and depth return to baseline  Outcome: Met  Patient respiratory rhythm regular and unlabored, no shortness of breath. Educated on patient on what to report. Will continue to monitor.    Problem: Skin Integrity- Intact patient high risk for impaired skin integrity Braden scale less than or equal to 18  Goal: Skin remains intact  Outcome: Met  No signs of skin breakdown or impaired skin integrity. Patient independent and ambulatory. Incision site and chest tube insertion site clean, dry, and intact. Will continue to assess

## 2014-04-08 NOTE — Progress Notes (Signed)
Anterior mediastinal chest tubes x 3 removed. CXR in AM

## 2014-04-08 NOTE — Interdisciplinary (Signed)
Patient spontaneously converted to NSR

## 2014-04-09 DIAGNOSIS — I481 Persistent atrial fibrillation: Secondary | ICD-10-CM

## 2014-04-09 DIAGNOSIS — J9811 Atelectasis: Secondary | ICD-10-CM

## 2014-04-09 DIAGNOSIS — I482 Chronic atrial fibrillation: Secondary | ICD-10-CM

## 2014-04-09 DIAGNOSIS — I2782 Chronic pulmonary embolism: Secondary | ICD-10-CM

## 2014-04-09 DIAGNOSIS — I480 Paroxysmal atrial fibrillation: Secondary | ICD-10-CM

## 2014-04-09 LAB — COMPREHENSIVE METABOLIC PANEL, BLOOD
ALT (SGPT): 17 U/L (ref 0–41)
AST (SGOT): 15 U/L (ref 0–40)
Albumin: 3.4 g/dL — ABNORMAL LOW (ref 3.5–5.2)
Alkaline Phos: 54 U/L (ref 40–129)
Anion Gap: 12 mmol/L (ref 7–15)
BUN: 45 mg/dL — ABNORMAL HIGH (ref 6–20)
Bicarbonate: 28 mmol/L (ref 22–29)
Bilirubin, Tot: 0.31 mg/dL (ref <1.20)
Calcium: 9 mg/dL (ref 8.5–10.6)
Chloride: 99 mmol/L (ref 98–107)
Creatinine: 1.85 mg/dL — ABNORMAL HIGH (ref 0.67–1.17)
GFR: 39 mL/min
Glucose: 129 mg/dL — ABNORMAL HIGH (ref 70–115)
Potassium: 4.9 mmol/L (ref 3.5–5.1)
Sodium: 139 mmol/L (ref 136–145)
Total Protein: 6.3 g/dL (ref 6.0–8.0)

## 2014-04-09 LAB — ECG 12-LEAD
ATRIAL RATE: 72 {beats}/min
QRS INTERVAL/DURATION: 144 ms
QT: 434 ms
QTC INTERVAL: 504 ms
R AXIS: -59 degrees
T AXIS: 105 degrees
VENTRICULAR RATE: 81 {beats}/min

## 2014-04-09 LAB — HEMOGRAM, BLOOD
Hct: 27.8 % — ABNORMAL LOW (ref 40.0–50.0)
Hgb: 8.7 gm/dL — ABNORMAL LOW (ref 13.7–17.5)
MCH: 27.5 pg (ref 26.0–32.0)
MCHC: 31.3 % — ABNORMAL LOW (ref 32.0–36.0)
MCV: 88 um3 (ref 79.0–95.0)
MPV: 9.4 fL (ref 9.4–12.4)
Plt Count: 418 10*3/uL — ABNORMAL HIGH (ref 140–370)
RBC: 3.16 10*6/uL — ABNORMAL LOW (ref 4.60–6.10)
RDW: 15.9 % — ABNORMAL HIGH (ref 12.0–14.0)
WBC: 9.6 10*3/uL (ref 4.0–10.0)

## 2014-04-09 LAB — APTT, BLOOD: PTT: 34.1 s — ABNORMAL HIGH (ref 25.0–34.0)

## 2014-04-09 LAB — GLUCOSE (POCT)
Glucose (POCT): 154 mg/dL — ABNORMAL HIGH (ref 70–115)
Glucose (POCT): 176 mg/dL — ABNORMAL HIGH (ref 70–115)
Glucose (POCT): 92 mg/dL (ref 70–115)
Glucose (POCT): 106 mg/dL (ref 70–115)

## 2014-04-09 LAB — PROTHROMBIN TIME, BLOOD
INR: 2.1
PT,Patient: 22.3 s — ABNORMAL HIGH (ref 9.7–12.5)

## 2014-04-09 MED ORDER — INSULIN LISPRO (HUMAN) 100 UNIT/ML SC SOLN (CUSTOM)
25.0000 [IU] | Freq: Three times a day (TID) | INTRAMUSCULAR | Status: DC
Start: 2014-04-09 — End: 2014-04-13
  Administered 2014-04-09 – 2014-04-13 (×12): 25 [IU] via SUBCUTANEOUS
  Filled 2014-04-09 (×12): qty 25

## 2014-04-09 NOTE — Progress Notes (Addendum)
Daily Progress Note:  04/09/2014     Current Hospital Stay:   12 days - Admitted on: 03/28/2014    Subjective:  POD#11 s/p PTE    CTs removed yesterday. Downstairs getting studies    Objective:  tmax 98.7. Vss. o2 sat 99%2LNC    Vital Signs:  Temperature:  [98.1 F (36.7 C)-98.7 F (37.1 C)] 98.6 F (37 C) (11/09 0743)  Blood pressure (BP): (130-151)/(65-70) 139/70 mmHg (11/09 0743)  Heart Rate:  [61-74] 61 (11/09 0743)  Respirations:  [18-20] 18 (11/09 0743)  Pain Score:  [-] 6 (11/09 1251)  O2 Device:  [-] Nasal cannula (11/09 0854)  O2 Flow Rate (L/min):  [2 l/min-3 l/min] 2 l/min (11/09 0854)  SpO2:  [98 %-99 %] 99 % (11/09 0854)    Wt Readings from Last 1 Encounters:   04/09/14 118.343 kg (260 lb 14.4 oz)       Intake/Output (Current Shift):    Intake/Output Summary (Last 24 hours) at 04/09/14 1258  Last data filed at 04/09/14 1200   Gross per 24 hour   Intake   1220 ml   Output   2701 ml   Net  -1481 ml     Current Facility-Administered Medications   Medication    acetaminophen (TYLENOL) solution 650 mg    dextrose 50 % solution 12.5 g    dextrose-sodium chloride 5%-0.45% infusion    docusate sodium (COLACE) capsule 250 mg    fenofibrate (TRICOR) tablet 145 mg    glucagon (GLUCAGON) injection 1 mg    glucose chewable tablet 16 g    glucose oral gel 1 Tube    insulin glargine (LANTUS) injection 35 Units    insulin glargine (LANTUS) injection 40 Units    insulin lispro (HUMALOG) injection 1-10 Units    insulin lispro (HUMALOG) injection 25 Units    levothyroxine (SYNTHROID) tablet 75 mcg    metoprolol tartrate (LOPRESSOR) tablet 25 mg    morphine injection 2 mg    nalOXone (NARCAN) injection 0.1 mg    omega-3 acid ethyl esters (LOVAZA) capsule 2 g    ondansetron (ZOFRAN) 8 mg in sodium chloride 0.9 % 50 mL IVPB    oxyCODONE (ROXICODONE) tablet 10 mg    oxyCODONE (ROXICODONE) tablet 5 mg    polyethylene glycol (MIRALAX) packet 17 g    prazosin (MINIPRESS) capsule 1 mg    ramelteon  (ROZEREM) tablet 8 mg    rosuvastatin (CRESTOR) tablet 20 mg    senna (SENOKOT) tablet 17.2 mg    warfarin (COUMADIN) tablet 8 mg     Laboratory data:   Lab Results   Component Value Date    NA 139 04/09/2014    K 4.9 04/09/2014    CL 99 04/09/2014    BICARB 28 04/09/2014    BUN 45* 04/09/2014    CREAT 1.85* 04/09/2014    GLU 129* 04/09/2014    [place] 9.0 04/09/2014     Lab Results   Component Value Date    WBC 9.6 04/09/2014    HGB 8.7* 04/09/2014    HCT 27.8* 04/09/2014    PLT 418* 04/09/2014     Lab Results   Component Value Date    AST 15 04/09/2014    ALT 17 04/09/2014    ALK 54 04/09/2014    TBILI 0.31 04/09/2014    TP 6.3 04/09/2014    ALB 3.4* 04/09/2014     Lab Results   Component Value Date    INR 2.1  04/09/2014    PTT 34.1* 04/09/2014       Assessment and Plan:  POD#11 s/p PTE    -anticoagulation with Coumadin. INR today 2.1  -DM2: on insulins  -hypothyroidism: on synthroid  -stage 3 CKD: Cr 1.85  -AF: in sinus rhythm. BB   -complete post op studies    Malachy Moan, MS, PA-C  Cardiothoracic Surgery  Pager [phone]      As above. Patient seen and examined. Continue care as outlined in the plan section above.

## 2014-04-09 NOTE — Progress Notes (Addendum)
PTE Progress note  Current Hospital  LOS: 12 days     Subjective: Feels well.  Walked three times (5 laps each time) yesterday without lightheadedness, mild SOB on 5th lap.  IS to 2200.  Regular BMs.      Tele:  Converted from atrial fib to NSR at 1800 yesterday (was in atrial fib for ~ 24 hours), HR 60-80s    Vitals:    Latest Entry  Range (last 24 hours)    Temperature: 98.6 F (37 C)  Temp  Avg: 98.5 F (36.9 C)  Min: 98.1 F (36.7 C)  Max: 98.7 F (37.1 C)    Blood pressure (BP): 139/70 mmHg  BP  Min: 126/61  Max: 151/65    Heart Rate: 61  Pulse  Avg: 69  Min: 61  Max: 74    Respirations: 18  Resp  Avg: 18.4  Min: 18  Max: 20    SpO2: 99 %  SpO2  Avg: 98.8 %  Min: 98 %  Max: 99 %     Weight - scale: 118.343 kg (260 lb 14.4 oz)  Percentage Weight Change (%): -0.22 %       Intake/Output Summary (Last 24 hours) at 04/09/14 0940  Last data filed at 04/09/14 0920   Gross per 24 hour   Intake   1220 ml   Output   2001 ml   Net   -781 ml     Exam:   Gen: NAD, comfortable in bed  HEENT: EOMI, PERRLA, OP without lesions  NECK: Supple, no cervical LAD  CV: RRR, no MRCG  RESP: CTAB, no wheezes, some bibasilar crackles  ABD: soft, NT/ND, NABS  EXT: No LE edema  NEURO: non-focal, grossly intact  SKIN:  Sternal incision clean and dry, no erythema    Labs:   CBC  Recent Labs      04/08/14   1235  04/09/14   0534   WBC  8.9  9.6   HGB  9.3*  8.7*   HCT  29.3*  27.8*   PLT  440*  418*      Chemistry  Recent Labs      04/08/14   1235  04/09/14   0534   NA  136  139   K  4.6  4.9   CL  97*  99   BICARB  25  28   BUN  45*  45*   CREAT  2.02*  1.85*   GLU  193*  129*   [place]  8.9  9.0   MG  2.1   --      Recent Labs      04/08/14   1235  04/09/14   0534   ALK  54  54   AST  14  15   ALT  18  17   TBILI  0.27  0.31   ALB  3.4*  3.4*        Coags  Recent Labs      04/08/14   0525  04/08/14   1235  04/09/14   0534   PT  20.8*  20.3*  22.3*   PTT  31.8   --   34.1*   INR  1.9  1.9  2.1       Medications:  Scheduled Meds   docusate  sodium  250 mg BID    fenofibrate  145 mg Daily    insulin glargine  35 Units HS  insulin glargine  40 Units QAM    insulin lispro  1-10 Units 4x Daily WC    insulin lispro  25 Units TID WC    levothyroxine  75 mcg QAM AC    metoprolol tartrate  25 mg Q12H    omega-3 acid ethyl esters  2 g BID    prazosin  1 mg Q12H    rosuvastatin  20 mg QPM    senna  2 tablet HS    warfarin  8 mg QPM     PRN Meds   acetaminophen  650 mg Q4H PRN    dextrose  12.5 g PRN    glucagon  1 mg Once PRN    glucose  4 tablet PRN    glucose  1 Tube PRN    morphine  2 mg Q4H PRN    nalOXone  0.1 mg Q2 Min PRN    ondansetron (ZOFRAN) IVPB  8 mg Q8H PRN    oxyCODONE  10 mg Q4H PRN    oxyCODONE  5 mg Q4H PRN    polyethylene glycol  17 g Daily PRN    ramelteon  8 mg Nightly PRN     IV Meds   dextrose-sodium chloride 5%-0.45% Stopped (03/31/14 1200)     ASSESSMENT/PLAN: [name] is a 48 year old male with PMH of childhood Wilms tumor, s/p nephrectomy, DM-II, hypothyroidism, HTN, OSA, CRI (baseline Cr 2.47) who underwent PTE 03/28/14.    1. S/p PTE, POD # 11 with good hemodynamic results (final hemodynamic result: CVP 12, PA 32/14 (22), CO 7.3 (3.0), PVR 132.   --Cont Coumadin 44m, INR goal 2.5-3.5.   --VQ 04/09/14:  Decreased left LL ventilation; significant perfusion to much of right lung, "perfusion steal" LLL.    --CXR 04/09/14:  Increased aeration, left basal atelectasis.    --Echo and R & E tomorrow.    2. Paroxsymal atrial fibrillation, converted to NSR yesterday evening, cont Lopressor 25 mg bid. Consider po amiodarone if remains in atrial fib.   3. DM II, HgA1C 8.2. Appreciate endocrinology assistance. Cont lantus insulin 40 units AM, 35 units PM, per endocrinology, increase Lispro insulin 25 units with meals, cont SSI.   4. CRI, Cr improved 1.8.   5. HTN, monitor.   6. Hypothyroidism, cont synthroid  7. Dyslipidemia, cont crestor.     Edwin Booze NP  Pulmonary and critical care  P:  [phone]   Pulmonary  Vascular Edwin Sandoval Addendum        Patient seen and examined with Edwin Sandoval.        Active, in good spirits; back in SR (HR 60s); no new complaints        Afebrile, BP 1852Dsystolic; O2 sat 978%2 liters        Still with bibasilar crackles L>R        No pericardial rub; no S3        Sternal incision w/o erythema or drainage        No LE edema    CXR: no PTX, no significant change in cardiac silhouette  BUN 45, Creat steady at 1.85, K+4.9, Na+139  WBC 9.6, Hct 27.8, platelets 418,000  INR 2.1    V/Q scan:  Increased perfusion throughout right lung; decreased perfusion LLL with persistent defect anterior LLL; poor ventilation left lung/LLL (medial), ? Technical    Post PTE with good pulmonary HD result; CKD; persistent mediastinal (? pleural) drainage; new onset A Fib-flutter  Making progress, with level of AC nearly at target of 2.5-3.5        Increase activities as able        Echo, R+E tomorrow if possible        Continue metoprolol at current dosing of 25 mg BID    Edwin Sandoval. Edwin Harbold, MD

## 2014-04-09 NOTE — Progress Notes (Signed)
Diabetes/Endocrine Progress Note    Overnight: no acute events. BGs elevated over the weekend    Subjective: wife present; patient comfortably sitting in chair; pt eating similar amounts of carbs from day to day; snacking on cheese and ham    Objective:  Temperature:  [98.1 F (36.7 C)-98.7 F (37.1 C)] 98.2 F (36.8 C) (11/09 1453)  Blood pressure (BP): (130-151)/(65-70) 135/68 mmHg (11/09 1453)  Heart Rate:  [61-74] 66 (11/09 1453)  Respirations:  [18-20] 18 (11/09 1453)  Pain Score:  [-] 2 (11/09 1453)  O2 Device:  [-] Nasal cannula (11/09 1453)  O2 Flow Rate (L/min):  [2 l/min-3 l/min] 2 l/min (11/09 1453)  SpO2:  [98 %-99 %] 98 % (11/09 1453)    GEN: NAD  Resp: unlabored    Diet: carb limited    Blood Sugars reviewed:                            AM      Lu        Di         HS       O/N    11/05  159 183 89 108  Lantus  40   35  Lispro  25+3 25+4 22    11/06  108 183 112 136  Lantus  40   Not given(pt/family refusal)  Lispro  22 22+4 22    11/07  118 250 128 201  Lantus  40   35  Lispro  22 22+6 22 4     11/08  234 197 181 215  Lantus  40   35  Lispro  22 22+4 22+4 4    11/09  154 176  Lantus  40  Lispro  22+3 25+4     A/P: Edwin Sandoval is a 48 year old male 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 BGs within target. Given good glycemic control in house and decreased TDD (total daily dose) of insulin, pt might benefit from using U-100 insulin instead of U-500 regimen prior to admission. Discussed it with pt and wife; pt does prefer insulin pens over vial and syringe. Readings ran higher over the weekend w/o changes to carb intake; noted that Lantus PM dose of 35 units on 11.06 was held and according to pt it was b/c HS reading was 136.    Inpatient Recs: (paged to 1st call)  -increase Lispro 22 to 25 units ac qac meals (RN to give 0, 1/2, whole dose based on % tray consumed)  -continue Lantus 40/35 units BID  -continue lispro  high dose correction scale achs    Outpatient Recs:  --Lispro 25 units qac meals  -Lantus 40/35 units BID  -Follow-up with PCP within 1-2 weeks of d/c    Education:    1. Healthy Eating: reiterated importance of consistent carb intake across meals with pt and wife; pt is also interested in wt management. Discussed with pt and wife that nutritional insulin dose may increase or decrease depending on carb intake. Discussed that free food items such as catsup may add up to meal carb count if large amount consumed; catsup is a pt's favorite. Left message to order HS snack for pt.    2. Hypoglycemia:reviewed s/sx and treatment by rule of 15 and reporting glucose < 70 mg/dL N5+x2+ in a week to PCP for  reevaluation of DM treatment regimen. Pt to ask RN to check BG in early AM if feels hungry prior to having snack before bkft.    3. Medication: discussed insulin (basal and nutritional) mechanism of action and that basal doses are administered daily and consistently w/o skipped doses unless BG trends indicate a need to reduce dose in attempt to avoid hypoglycemia    Thank you for this interesting consult, will cont to follow    Will cont to follow. Please page [phone] with questions

## 2014-04-09 NOTE — Plan of Care (Signed)
Problem: Tissue Perfusion, Cardiopulmonary - Altered  Goal: Early recognition of deterioration  Outcome: Met  Assess per unit policy standard and procedures.  Pt reminded to notify RN with chest pain, pressure, and palpitations. Patient is on continuous telemetry monitoring.      SR   Filed Vitals:     04/09/14 0542 04/09/14 0743 04/09/14 0854 04/09/14 1453   BP:   139/70   135/68   Pulse:   61   66   Temp:   98.6 F (37 C)   98.2 F (36.8 C)   Resp:   18   18   Height:           Weight: 118.343 kg (260 lb 14.4 oz)         SpO2:   99% 99% 98%   All pulses palpable.  Skin is warm and dry.  Patient denies chest pain, pressure, and palpitations.    AM Labs:   Lab Results   Component Value Date     NA 139 04/09/2014     K 4.9 04/09/2014     CL 99 04/09/2014     BICARB 28 04/09/2014     BUN 45 04/09/2014     CREAT 1.85 04/09/2014     GLU 129 04/09/2014     [place] 9.0 04/09/2014   RN will continue to monitor and to update as needed.         Problem: Falls, Risk of  Goal: Keep patient free from falls utilizing universal fall precautions  Outcome: Met  Assess fall risk.  Call light is within reach.  Bed rails up X2.  Bed is in lowest position and locked.  Non-skid footware in place.  Pt is made aware to call nurse for assistance as needed.  Toileting assistance is offered Q2h and prn.    Patient remains free from falls.  Pt is A&OX3, verbalizes an understanding to call nurse for assistance as needed.   Pts gait is steady and he is ambulating around the hallway independently.  Pt voiding to bathroom independently.  RN will continue to monitor and to update as needed.        Problem: Alteration in Blood Glucose  Goal: Glucose level within specified parameters  Outcome: Met  Assess and monitor blood sugar and administer sliding scale insulin as ordered.  Educate patient on signs and symptoms of hypoglycemia/hyperglycemia.   Blood glucose was monitored and managed.  Patient verbalized understanding of blood glucose monitoring.   No signs and symptoms of hypoglycemia or hyperglycemia at this time. Will continue to monitor and to update as needed.         Problem: Discharge Planning  Goal: Participation in care planning  Outcome: Met  Discharge planning started.  No discharge orders given at this time.  RN will continue to monitor patient closely.  Goal: Verbalizes/demonstrates knowledge of discharge instructions  Outcome: Met  Goal: Verbalize knowledge of prescribed medication management plan  Outcome: Met  Goal: Able to perform ADL- independently or with minimal assist  Outcome: Met    Problem: Pain - Acute  Goal: Communication of presence of pain  Outcome: Met  Monitor pain level Q4h and prn.  Assess pain level using 0-10 pain scale.  Discuss pain management with pt and administer medications as ordered. Assist pt with repositioning prn for comfort.  Reassess pt following administration of pain medications/ comfort measures for effectiveness.   Monitor respiratory rate and LOC.  Pt is A&OX3.  Pt  c/o incisional  pain 6/10.   Pt ordered  PO pain medication, please see MAR for time and dose of administration.   Upon follow-up assessment pt states pain is 2/10.  Pt verbalizes an understanding to call nurse for pain meds as needed.  RN will continue to monitor pain level and to update as needed.        Goal: Control of acute pain  Outcome: Met  Please see previous note.  Goal: Knowledge of pain management methods  Outcome: Met    Problem: Bleeding, Risk of  Goal: Absence of active bleeding  Outcome: Met  Assess pt for signs and symptoms of bleeding Q4h and prn.  Instruct patient to promptly report any signs and symptoms of bleeding.  Monitor vital signs and labs as ordered and report any abnormal results and any signs and symptoms of bleeding to MD. Pt remains free from s/s of active bleeding.  Pt is A&OX3, VSS:   Filed Vitals:     04/09/14 0542 04/09/14 0743 04/09/14 0854 04/09/14 1453   BP:   139/70   135/68   Pulse:   61   66   Temp:   98.6  F (37 C)   98.2 F (36.8 C)   Resp:   18   18   Height:           Weight: 118.343 kg (260 lb 14.4 oz)         SpO2:   99% 99% 98%   Labs:   Lab Results   Component Value Date     WBC 9.6 04/09/2014     RBC 3.16 04/09/2014     HGB 8.7 04/09/2014     HCT 27.8 04/09/2014     MCV 88.0 04/09/2014     MCHC 31.3 04/09/2014     RDW 15.9 04/09/2014     PLT 418 04/09/2014     MPV 9.4 04/09/2014   Peripheral IV free from drainage.  Incision is clean, dry and open to air.  RN will continue to monitor and to update as needed.          Goal: Knowledge of bleeding precautions  Outcome: Met  Please see previous note.    Problem: Breathing Pattern - Ineffective  Goal: Respiratory rate, rhythm and depth return to baseline  Outcome: Met  Assess respiratory status Q4h and prn.  Pt is currently on 2 liters with oxygen saturation in the high 90's.  Lung sounds are clear with diminished bases. Respiratory rate is even and non-labored.  Patient denies cough and shortness of breath at this time.  No respiratory distress noted.  RN will continue to monitor and to update as needed.         Problem: Skin Integrity- Intact patient high risk for impaired skin integrity Braden scale less than or equal to 18  Goal: Skin remains intact  Outcome: Met  Assess skin integrity Q shift and prn. Pt is s/p PTE with midsternal incision.  Incision is clean, dry and open to air.  No other skin issues noted. Skin is warm and dry. RN will continue to monitor and to update as needed.      Goal: Knowledge of skin protection measures  Outcome: Met    Problem: Skin Integrity Impaired: Secondary to pressure ulcer(admission or hospital acquired), cellulitis, incontinence, surgical wound/incision, chronic wound or skin disease  Goal: Absence of infection signs and symptoms  Change when soiled, integrity impaired, physican order, or, no longer than  7 days.   Outcome: Met  Goal: Decrease in wound size: 10-15% weekly  Outcome: Met  Goal: Prevent further skin  compromise  Outcome: Met

## 2014-04-10 DIAGNOSIS — I059 Rheumatic mitral valve disease, unspecified: Secondary | ICD-10-CM

## 2014-04-10 LAB — 2D ECHO WITH IMAGE ENHANCEMENT AGENT IF NECESSARY
Cardac Output: 5.1 L/min (ref 4.0–8.0)
Cardiac Index: 2.2 L/min/m2 — ABNORMAL LOW (ref 2.8–4.2)
LA Volume Index: 37 mL/m2 — ABNORMAL HIGH (ref 16–28)
LV Ejection Fraction: 44 % — ABNORMAL LOW (ref >50)
PA Pressure: 20 mm Hg + CVP (ref 20–30)

## 2014-04-10 LAB — ABG+O2SAT+O2HBA
BE, Art: 1.3 mmol/L — ABNORMAL HIGH (ref <=1.2)
FIO2: 21 %
HCO3, Art: 25 mmol/L (ref 23–29)
O2 Hgb, Art: 94.9 — ABNORMAL LOW (ref 95.0–97.0)
O2 Sat, Art: 96.1 % (ref 94–100)
Temp: 36.8 'C
pCO2, Art (T): 37 mmHg (ref 36–46)
pCO2, Art (Uncorr): 38 mmHg (ref 36–46)
pH, Art (T): 7.45 (ref 7.35–7.46)
pH, Art (Uncorr): 7.45 (ref 7.35–7.46)
pO2, Art (T): 82 mmHg (ref 74–109)
pO2, Art (Uncorr): 83 mmHg (ref 74–109)

## 2014-04-10 LAB — GLUCOSE (POCT)
Glucose (POCT): 133 mg/dL — ABNORMAL HIGH (ref 70–115)
Glucose (POCT): 131 mg/dL — ABNORMAL HIGH (ref 70–115)
Glucose (POCT): 114 mg/dL (ref 70–115)
Glucose (POCT): 200 mg/dL — ABNORMAL HIGH (ref 70–115)
Glucose (POCT): 206 mg/dL — ABNORMAL HIGH (ref 70–115)

## 2014-04-10 LAB — BLOOD GAS HGB/HCT,ARTERIAL
Hct (Est), Art: 30 % — ABNORMAL LOW (ref 40–50)
Hgb, Art: 10.3 g/dL — ABNORMAL LOW (ref 14.0–17.0)

## 2014-04-10 LAB — METHEMOGLOBIN, ARTERIAL: Met Hgb, Art: 0.3 % (ref 0.0–1.5)

## 2014-04-10 LAB — CARBOXYHEMOGLOBIN, ARTERIAL: CO Hgb, Art: 0.9 %

## 2014-04-10 LAB — PROTHROMBIN TIME, BLOOD
INR: 2.4
PT,Patient: 25.6 s — ABNORMAL HIGH (ref 9.7–12.5)

## 2014-04-10 MED ORDER — METOPROLOL TARTRATE 1 MG/ML IV SOLN
5.0000 mg | Freq: Once | INTRAVENOUS | Status: DC
Start: 2014-04-10 — End: 2014-04-10

## 2014-04-10 MED ORDER — FUROSEMIDE 40 MG OR TABS
20.0000 mg | ORAL_TABLET | Freq: Every day | ORAL | Status: DC
Start: 2014-04-11 — End: 2014-04-13
  Administered 2014-04-11 – 2014-04-13 (×3): 20 mg via ORAL
  Filled 2014-04-10 (×3): qty 1

## 2014-04-10 MED ORDER — AMIODARONE HCL 200 MG OR TABS
400.0000 mg | ORAL_TABLET | Freq: Two times a day (BID) | ORAL | Status: DC
Start: 2014-04-10 — End: 2014-04-12
  Administered 2014-04-10 – 2014-04-12 (×5): 400 mg via ORAL
  Filled 2014-04-10 (×5): qty 2

## 2014-04-10 MED ORDER — WARFARIN SODIUM 5 MG OR TABS
5.0000 mg | ORAL_TABLET | Freq: Every evening | ORAL | Status: DC
Start: 2014-04-10 — End: 2014-04-11
  Administered 2014-04-10: 5 mg via ORAL
  Filled 2014-04-10: qty 1

## 2014-04-10 MED ORDER — MAGNESIUM HYDROXIDE 400 MG/5ML OR SUSP
30.00 mL | Freq: Once | ORAL | Status: AC | PRN
Start: 2014-04-10 — End: 2014-04-10
  Administered 2014-04-10: 30 mL via ORAL
  Filled 2014-04-10: qty 30

## 2014-04-10 NOTE — Progress Notes (Addendum)
Daily Progress Note:  04/10/2014     Current Hospital Stay:   13 days - Admitted on: 03/28/2014    Subjective:  POD#12 s/p PTE    Resting comfortably in bed. No complaints    Objective:  tmax 98.8. Vss. o2 sat 96%2LNC    Vital Signs:  Temperature:  [98.2 F (36.8 C)-98.8 F (37.1 C)] 98.3 F (36.8 C) (11/10 0752)  Blood pressure (BP): (135-150)/(68-85) 149/75 mmHg (11/10 0752)  Heart Rate:  [60-108] 84 (11/10 0531)  Respirations:  [18] 18 (11/10 0752)  Pain Score:  [-] 0 (11/10 0752)  O2 Device:  [-] Nasal cannula (11/10 0752)  O2 Flow Rate (L/min):  [2 l/min] 2 l/min (11/10 0752)  SpO2:  [94 %-98 %] 96 % (11/10 0752)    Wt Readings from Last 1 Encounters:   04/10/14 117.8 kg (259 lb 11.2 oz)       Intake/Output (Current Shift):      Intake/Output Summary (Last 24 hours) at 04/10/14 1040  Last data filed at 04/10/14 0515   Gross per 24 hour   Intake    670 ml   Output   2500 ml   Net  -1830 ml     Current Facility-Administered Medications   Medication    acetaminophen (TYLENOL) solution 650 mg    amiodarone (PACERONE) tablet 400 mg    dextrose 50 % solution 12.5 g    dextrose-sodium chloride 5%-0.45% infusion    docusate sodium (COLACE) capsule 250 mg    fenofibrate (TRICOR) tablet 145 mg    glucagon (GLUCAGON) injection 1 mg    glucose chewable tablet 16 g    glucose oral gel 1 Tube    insulin glargine (LANTUS) injection 35 Units    insulin glargine (LANTUS) injection 40 Units    insulin lispro (HUMALOG) injection 1-10 Units    insulin lispro (HUMALOG) injection 25 Units    levothyroxine (SYNTHROID) tablet 75 mcg    magnesium hydroxide (MILK OF MAGNESIA) suspension 30 mL    metoprolol tartrate (LOPRESSOR) tablet 25 mg    morphine injection 2 mg    nalOXone (NARCAN) injection 0.1 mg    omega-3 acid ethyl esters (LOVAZA) capsule 2 g    ondansetron (ZOFRAN) 8 mg in sodium chloride 0.9 % 50 mL IVPB    oxyCODONE (ROXICODONE) tablet 10 mg    oxyCODONE (ROXICODONE) tablet 5 mg    polyethylene  glycol (MIRALAX) packet 17 g    prazosin (MINIPRESS) capsule 1 mg    ramelteon (ROZEREM) tablet 8 mg    rosuvastatin (CRESTOR) tablet 20 mg    senna (SENOKOT) tablet 17.2 mg    warfarin (COUMADIN) tablet 5 mg       Physical Exam:  General Appearance: healthy, alert, no distress, pleasant affect, cooperative, skin warm, dry, and pink.  Skin:  negative.    Laboratory data:   Lab Results   Component Value Date    NA 139 04/09/2014    K 4.9 04/09/2014    CL 99 04/09/2014    BICARB 28 04/09/2014    BUN 45* 04/09/2014    CREAT 1.85* 04/09/2014    GLU 129* 04/09/2014    [place] 9.0 04/09/2014     Lab Results   Component Value Date    WBC 9.6 04/09/2014    HGB 8.7* 04/09/2014    HCT 27.8* 04/09/2014    PLT 418* 04/09/2014     Lab Results   Component Value Date    AST 15  04/09/2014    ALT 17 04/09/2014    ALK 54 04/09/2014    TBILI 0.31 04/09/2014    TP 6.3 04/09/2014    ALB 3.4* 04/09/2014     Lab Results   Component Value Date    INR 2.4 04/10/2014    PTT 34.1* 04/09/2014       Assessment and Plan:  POD#12 s/p PTE    -anticoagulation with Coumadin. INR today 2.4  -DM2: on insulins  -hypothyroidism: on synthroid  -stage 3 CKD: Cr 1.85  -AF: on Amio 424m BID, Metop 285mBID  -VQ done. Awaiting echo and R&E    AnMalachy MoanMS, PA-C  Cardiothoracic Surgery  Pager [phone]

## 2014-04-10 NOTE — Progress Notes (Signed)
Diabetes/Endocrine Progress Note    Overnight: no acute events. BGs running within target    Subjective: Pt resting comfortably in bed    Objective:  Temperature:  [97.9 F (36.6 C)-98.8 F (37.1 C)] 97.9 F (36.6 C) (11/10 2004)  Blood pressure (BP): (111-150)/(64-89) 130/64 mmHg (11/10 2004)  Heart Rate:  [60-124] 124 (11/10 1837)  Respirations:  [18] 18 (11/10 0752)  Pain Score:  [-] 5 (11/10 2041)  O2 Device:  [-] None (Room air) (11/10 2004)  O2 Flow Rate (L/min):  [2 l/min] 2 l/min (11/10 0752)  SpO2:  [94 %-97 %] 97 % (11/10 2004)    GEN: NAD  Resp: unlabored    Diet: carb limited    Blood Sugars reviewed:                            AM      Lu        Di         HS       O/N    11/05  159 183 89 108  Lantus  40   35  Lispro  25+3 25+4 22    11/06  108 183 112 136  Lantus  40   Not given(pt/family refusal)  Lispro  22 22+4 22    11/07  118 250 128 201  Lantus  40   35  Lispro  22 22+6 22 4     11/08  234 197 181 215  Lantus  40   35  Lispro  22 22+4 22+4 4    11/09  154 176 92 106  Lantus  40   35  Lispro  22+3 25+4 25    11/10  133 131  Lantus  40  Lispro  25 25       A/P: Edwin SlocumbJohn Michael Sandoval is a 48 year old male 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 BGs within target. Given good glycemic control in house and decreased TDD (total daily dose) of insulin, pt might benefit from using U-100 insulin instead of U-500 regimen prior to admission. Discussed it with pt and wife; pt does prefer insulin pens over vial and syringe. BGs running within target    Inpatient Recs: (paged to 1st call)  -continue Lispro 25 units ac qac meals (RN to give 0, 1/2, whole dose based on % tray consumed)  -continue Lantus 40/35 units BID  -continue lispro high dose correction scale achs    Outpatient Recs:  --Lispro 25 units qac meals  - Lantus 40/35 units BID  - Follow-up with PCP within 1-2 weeks of d/c    Will cont to follow. Please page [phone] with  questions

## 2014-04-10 NOTE — Procedures (Signed)
No note

## 2014-04-10 NOTE — Plan of Care (Signed)
Problem: Tissue Perfusion, Cardiopulmonary - Altered  Goal: Early recognition of deterioration  Pt. On continuous telemetry, in Afib on monitor, heart rate in the 70s.  Per team pt started PO amio today.  RN will continue to monitor.     Problem: Falls, Risk of  Goal: Keep patient free from falls utilizing universal fall precautions   Pt educated on fall risks and prevention, pt is AOx4 verbalized understanding, uses call light appropriately for needs.  Pt is low fall risk, steady gait. Room is organized and free from clutter. Will coordinate hourly rounding with CCP and PRN rounding per pt needs.        Problem: Alteration in Blood Glucose  Goal: Glucose level within specified parameters  BS does not require coverage for morning or afternoon. Scheduled insulin given per MAR.      Problem: Discharge Planning  Goal: Participation in care planning  No discharge orders at this time. Pt. Actively participating in care planning, verbalizes understanding of when reviewing plan of care. Pt. Wishes and needs incorporated into plan of care for shift. RN will continue to encourage active participation and update pt with changes to plan of care.     Problem: Pain - Acute  Goal: Communication of presence of pain  Pt. communicates  pain effectively using verbal scale, understands available PRN medications. Pt. And RN discussed pain intervention, pt will inform RN early to avoid increasing pain levels. Pain meds given per order, see MAR for details.     Problem: Bleeding, Risk of  Goal: Absence of active bleeding  No s/s of active bleeding. Will continue to monitor.     Problem: Breathing Pattern - Ineffective  Goal: Respiratory rate, rhythm and depth return to baseline  Pt. On 2L NC, sats upper 90s.  Rest and exercise study today.     Problem: Skin Integrity- Intact patient high risk for impaired skin integrity Braden scale less than or equal to 18  Goal: Skin remains intact  Sternal incision covered with steri strips.  Old CT  sites covered with dressing.  RN will continue to monitor.

## 2014-04-10 NOTE — Interdisciplinary (Signed)
Nutrition F/U note    48 year old male admitted with PMH of childhood Wilms tumor, s/p nephrectomy, DM-II, hypothyroidism, HTN, OSA, CRI (baseline Cr 2.47) who underwent PTE 03/28/14   PMH: No past medical history on file.  Current diet order:Carb Limited   Chewing/Swallowing difficulty:none   Nausea/vomiting/diarrhea:none   Reported appetite:good   Documented PO intake:4/4,4/4,3/3,3/3,4/5,4/4  Height: 5\' 11"  (180.3 cm)  Weight - scale: 117.8 kg (259 lb 11.2 oz)   Body mass index is 36.24 kg/(m^2).  Weights (last 14 days)     Date/Time Weight Who    04/10/14 0515 117.8 kg (259 lb 11.2 oz) RA    04/09/14 0542 118.343 kg (260 lb 14.4 oz) KS    04/08/14 0626 118.6 kg (261 lb 7.5 oz) EJ    04/07/14 0656 120.9 kg (266 lb 8.6 oz) MG    04/06/14 0650 122 kg (268 lb 15.4 oz) AD    04/05/14 0600 121 kg (266 lb 12.1 oz) [place]    04/04/14 0543 121.7 kg (268 lb 4.8 oz) MG    04/03/14 0600 122 kg (268 lb 15.4 oz) DC    04/02/14 0824 122.6 kg (270 lb 4.5 oz) KB    04/01/14 0400 122 kg (268 lb 15.4 oz) PL    03/31/14 0659 123 kg (271 lb 2.7 oz) KSA    03/30/14 0400 111.2 kg (245 lb 2.4 oz) MM    03/28/14 1628 124.2 kg (273 lb 13 oz) AR          Skin integrity:Incision Sternum Anterior;Mid from head to toe assessment  Labs:(POC blood sugars [phone],201,234,197,215,176,106,133)  Nutritionally relevant meds: insulin,senna,warfarin,omega-3 acids  Pt nutrition care level: low (level 1)  Plan: Will provide snacks as approved per Registered Dietitian.  Will continue to provide basic nutrition services and reclassify nutrition status per policy.  Pt status information relayed to Registered Dietitian.    Charleen KirksErica Hurt, DTR

## 2014-04-10 NOTE — Progress Notes (Addendum)
PTE Progress note  Current Hospital  LOS: 13 days     Subjective: Feels well.  Converted to atrial fib this am at controlled ventricular rates, without sxs.  Walked three times yesterday, incisional pain controlled.  IS to 2500.  BM yesterday but constipated.      Tele:  NSR-->to atrial fibrillation at 5:15 am, vent rates 70-80    Vitals:    Latest Entry  Range (last 24 hours)    Temperature: 98.3 F (36.8 C)  Temp  Avg: 98.5 F (36.9 C)  Min: 98.2 F (36.8 C)  Max: 98.8 F (37.1 C)    Blood pressure (BP): 149/75 mmHg  BP  Min: 135/68  Max: 150/85    Heart Rate: 84  Pulse  Avg: 79.8  Min: 60  Max: 108    Respirations: 18  Resp  Avg: 18  Min: 18  Max: 18    SpO2: 96 %  SpO2  Avg: 96.6 %  Min: 94 %  Max: 98 %     Weight - scale: 117.8 kg (259 lb 11.2 oz)  Percentage Weight Change (%): -0.46 %       Intake/Output Summary (Last 24 hours) at 04/10/14 0911  Last data filed at 04/10/14 0515   Gross per 24 hour   Intake   1030 ml   Output   2500 ml   Net  -1470 ml     Exam:   Gen: NAD, comfortable in bed  HEENT: EOMI, PERRLA, OP without lesions  NECK: Supple, no cervical LAD  CV: RRR, no MRCG  RESP: CTAB, no wheezes/rales  ABD: soft, NT/ND, NABS  EXT: No LE edema  NEURO: non-focal, grossly intact  SKIN:  Sternal incision clean and dry, no erythema    Labs:   CBC  Recent Labs      04/08/14   1235  04/09/14   0534   WBC  8.9  9.6   HGB  9.3*  8.7*   HCT  29.3*  27.8*   PLT  440*  418*      Chemistry  Recent Labs      04/08/14   1235  04/09/14   0534   NA  136  139   K  4.6  4.9   CL  97*  99   BICARB  25  28   BUN  45*  45*   CREAT  2.02*  1.85*   GLU  193*  129*   [place]  8.9  9.0   MG  2.1   --      Recent Labs      04/08/14   1235  04/09/14   0534   ALK  54  54   AST  14  15   ALT  18  17   TBILI  0.27  0.31   ALB  3.4*  3.4*        Coags  Recent Labs      04/08/14   0525   04/09/14   0534  04/10/14   0553   PT  20.8*   < >  22.3*  25.6*   PTT  31.8   --   34.1*   --    INR  1.9   < >  2.1  2.4    < > = values in this  interval not displayed.       Medications:  Scheduled Meds   docusate sodium  250 mg BID  fenofibrate  145 mg Daily    insulin glargine  35 Units HS    insulin glargine  40 Units QAM    insulin lispro  1-10 Units 4x Daily WC    insulin lispro  25 Units TID WC    levothyroxine  75 mcg QAM AC    metoprolol tartrate  25 mg Q12H    omega-3 acid ethyl esters  2 g BID    prazosin  1 mg Q12H    rosuvastatin  20 mg QPM    senna  2 tablet HS    warfarin  8 mg QPM     PRN Meds   acetaminophen  650 mg Q4H PRN    dextrose  12.5 g PRN    glucagon  1 mg Once PRN    glucose  4 tablet PRN    glucose  1 Tube PRN    magnesium hydroxide  30 mL Once PRN    morphine  2 mg Q4H PRN    nalOXone  0.1 mg Q2 Min PRN    ondansetron (ZOFRAN) IVPB  8 mg Q8H PRN    oxyCODONE  10 mg Q4H PRN    oxyCODONE  5 mg Q4H PRN    polyethylene glycol  17 g Daily PRN    ramelteon  8 mg Nightly PRN     IV Meds   dextrose-sodium chloride 5%-0.45% Stopped (03/31/14 1200)       ASSESSMENT/PLAN: Edwin Sandoval is a 48 year old male with PMH of childhood Wilms tumor, s/p nephrectomy, DM-II, hypothyroidism, HTN, OSA, CRI (baseline Cr 2.47) who underwent PTE 03/28/14.    1. S/p PTE, POD # 12 with good hemodynamic results (final hemodynamic result: CVP 12, PA 32/14 (22), CO 7.3 (3.0), PVR 132.   --Decrease Coumadin 9m (amio added), INR goal 2.5-3.5.   --VQ 04/09/14: Decreased left LL ventilation; significant perfusion to much of right lung, "perfusion steal" LLL.   --CXR 04/09/14: Increased aeration, left basal atelectasis.   --Echo 04/10/14:  Pending  --R & E 04/10/14:  pending.   2. Paroxsymal atrial fibrillation, converted to atrial fib this morning with controlled vent rates, begin amiodarone po 400 mg bid.     3. DM II, HgA1C 8.2. Appreciate endocrinology assistance. Cont lantus insulin 40 units AM, 35 units PM, per endocrinology, increase Lispro insulin 25 units with meals, cont SSI.   4. CRI, Cr improved 1.8.   5. HTN, cont lopressor for  blood pressure control.    6. Hypothyroidism, cont synthroid  7. Dyslipidemia, cont crestor.     TJettie Booze NP  Pulmonary and critical care  P:  [phone]   Pulmonary Vascular Attending Addendum        Patient seen and examined with Edwin Sandoval.        Generally doing well; back in A Fib this today, prompting initiation of amiodarone        Otherwise, afebrile, BP [id number] with HR 767E-93Y O2 sat 96% 2 liters        Better aeration with fewer rales at lung bases        No rub, S3        Sternal incision w/o erythema or drainage        No LE edema    INR 2.4  Echo: mild dilated LV with depressed systolic function, mild RAE. RVE; no pericardial effusion; TR 2.25 (20+cvp), est CO 5.1  R+E: RA ABG: 7.45, pCO2 37. PO2 82. 98% to  98% 1.5 mph    Post PTE with good pulmonary HD result; CKD; new onset A Fib-flutter        With echo findings and HTN, will resume diuretics        Amiodarone for A fib-flutter as metoprolol not keeping him under control        Adjust coumadin accordingly with addition of amiodarone        O2 Rx: not required    Talmage Coin. Tayshun Gappa, MD

## 2014-04-10 NOTE — Plan of Care (Signed)
Problem: Tissue Perfusion, Cardiopulmonary - Altered  Goal: Early recognition of deterioration  Outcome: Pt free of cardiopulmonary distress  Pt Edwin Sandoval, 48 year old, male, s/p PTE was assessed and is being monitored for altered cardiopulmonary status.  On assessment patient's skin was pink, warm, and dry. Pt denies CP, palpitations, SOB or dyspnea / dyspneic on exertion.   Strong peripheral pulses in all extremities, full sensation, No edema noted, skin with rapid capillary refill. Pt denies dizziness at rest or with ambulation. Verbalizes understanding to notify RN for SOB/CP immediately or numbness/ tingling sensations. S1/S2 heard on auscultation.       Will continue to monitor.      Temperature:  [98.1 F (36.7 C)-98.8 F (37.1 C)] 98.8 F (37.1 C) (11/09 2100)  Blood pressure (BP): (130-141)/(65-71) 141/71 mmHg (11/09 2100)  Heart Rate:  [61-81] 81 (11/09 2100)  Respirations:  [18] 18 (11/09 2100)  Pain Score:  [-] 2 (11/09 2200)  O2 Device:  [-] Nasal cannula (11/09 2100)  O2 Flow Rate (L/min):  [2 l/min] 2 l/min (11/09 2100)  SpO2:  [98 %-99 %] 98 % (11/09 2100)        Problem: Falls, Risk of  Goal: Keep patient free from falls utilizing universal fall precautions  Outcome: Fall precautions in place - no fall today  Pt Edwin Sandoval, male, 48 year old s/p PTE was educated on fall risks and prevention, pt verbalized understanding, uses call light appropriately for needs and does not get out of bed without staff present,  Pt is oriented X4. Pt is deemed low fall risk according to Fall Risk Assessment.   Pt bed is low, call light and bedside table within reach, anti-skid socks on, 2/4 bed rails up, bed alarm is on and audible for additional safety / patient refuses bed alarm inspite education received about pt's high risk of falls, consequences of falls and fall prevention precautions. Pt verbalizes understanding. Board updated with instruction to call and not fall. Room is organized and  free from clutter. Will do hourly rounding and PRN rounding per pt needs.              Problem: Alteration in Blood Glucose  Goal: Glucose level within specified parameters  Outcome: Pt has no episodes of hypo/hyperglycemia today  Pt Edwin Sandoval, 48 year old, male s/p PTE HS BG = 106 no additional coverage needed. Pt received 35 units of Lantus.  Educated on s/s of hypo/hyper glycemia and to call if symptoms occur. PT verbalized understanding.  Will continue to monitor.      Lab Results   Component Value Date     A1C 8.2 03/29/2014     Recent Labs     04/09/14   0846  04/09/14   1211  04/09/14   1734  04/09/14   2049   GLUCPOCT  154*  176*  92  106       Problem: Discharge Planning  Goal: Participation in care planning  Outcome: Pt participates in care planning to be ready for discharge  Pt Edwin Sandoval, 48 year old, male s/p PTE is involved with care plan by stating "to have tolerable pain and to increase activity" as a goal by end of shift.  Discussed with patient treatment/care plan to be ready for d/c such as but not limited to doing his IS and walking more.  PT walked 2 laps this shift and was able to reach 2200 ml on his IS.  Pt verbalized understanding and agreement to plan.  Will continue to monitor.   Goal: Verbalizes/demonstrates knowledge of discharge instructions  Ongoing.  Please see previous note.   Goal: Verbalize knowledge of prescribed medication management plan  Ongoing.  Please see previous note.   Goal: Able to perform ADL- independently or with minimal assist  Ongoing.  Please see previous note.     Problem: Pain - Acute  Goal: Communication of presence of pain  Outcome: Pain regimen effective this shift  Pt Edwin Sandoval, 48 year old, male s/p PTE was assessed and is being monitored for pain this shift.  Pt is able to communicate presence of pain effectively using verbal scale, understands available PRN medications. Educated pt to notify RN early to avoid increasing pain  levels. Pt c/o incisional pain Pain Score: 2 when coughing, well controlled by PRN medication. Comfort measures and other non-pharm techniques incorporated in addition to medication. Pt educated to call RN for chest pain and notify immediately if for any complaints of pain/discomfort. Will continue to assess pain with hourly rounding/PRN per pt needs.    Pain Score: 2  Goal: Control of acute pain  Ongoing.  Please see previous note.   Goal: Knowledge of pain management methods  Ongoing.  Please see previous note.     Problem: Bleeding, Risk of  Goal: Absence of active bleeding  Outcome: No bleeding s/s today   Pt Edwin Sandoval, 48 year old, male s/p PTE for was assessed and is being monitored for active bleeding.   Pt is on warfarin for prevention of blood clots. No bruising, petechiae, or hematoma noted.  .  Incision/ wound CDI with no s/s bleeding.  Pt verbalized understanding for S/S of bleeding and to notify RN with any changes. Will continue to monitor for bleeding.      Lab Results   Component Value Date     INR 2.1 04/09/2014     PTT 34.1* 04/09/2014         Lab Results   Component Value Date     WBC 9.6 04/09/2014     RBC 3.16 04/09/2014     HGB 8.7 04/09/2014     HCT 27.8 04/09/2014     MCV 88.0 04/09/2014     MCHC 31.3 04/09/2014     RDW 15.9 04/09/2014     PLT 418 04/09/2014     MPV 9.4 04/09/2014         Goal: Knowledge of bleeding precautions  Ongoing.  Please see previous note.     Problem: Breathing Pattern - Ineffective  Goal: Respiratory rate, rhythm and depth return to baseline  Outcome: Absence of respiratory distress this shift  Pt Edwin Sandoval, 48 year old, male s/p PTE was assessed and is being monitored for impaired breatrhing.  On assessment patient lungs sounds clear no SOB or dyspnea with exertion. The Pt is on 2L with oxygen saturation of 98%. Respiratory rate and depth  is WNL, rhythm is regular. Pt educated to use IS while awake. HOB is adjusted to comfortable level. --  cough. Please see EMR flow sheet for in depth assessment.      Will continue to monitor.         Blood pressure 141/71, pulse 81, temperature 98.8 F (37.1 C), resp. rate 18, height 5\' 11"  (1.803 m), weight 118.343 kg (260 lb 14.4 oz), SpO2 98 %.    Problem: Skin Integrity- Intact patient high risk for impaired skin integrity  Braden scale less than or equal to 18  Goal: Skin remains intact  Outcome: Pt has no new skin breakdowns thus far this shift  Pt Edwin Sandoval, 48 year old, male, s/p PTE  was assessed and is being monitored for skin breakdown.  On assessment pt is AOx 4-, MAE, able to move around in bed.  Coccyx absent of redness.  Skin under medical devices assessed as well and was found to be intact and free of skin breakdown.   Educated pt regarding prevention of skin breakdown by changing  position Q 2 hours. Pt verbalizes understanding.  Will remind as needed and continue to monitor.  Goal: Knowledge of skin protection measures  Ongoing.  Please see previous note.    Problem: Skin Integrity Impaired: Secondary to pressure ulcer(admission or hospital acquired), cellulitis, incontinence, surgical wound/incision, chronic wound or skin disease  Goal: Absence of infection signs and symptoms  Change when soiled, integrity impaired, physican order, or, no longer than 7 days.   Ongoing.  Please see previous note.   Goal: Prevent further skin compromise  Ongoing.  Please see previous note.

## 2014-04-11 LAB — HEMOGRAM, BLOOD
Hct: 28.3 % — ABNORMAL LOW (ref 40.0–50.0)
Hgb: 9.1 gm/dL — ABNORMAL LOW (ref 13.7–17.5)
MCH: 28.1 pg (ref 26.0–32.0)
MCHC: 32.2 % (ref 32.0–36.0)
MCV: 87.3 um3 (ref 79.0–95.0)
MPV: 9.3 fL — ABNORMAL LOW (ref 9.4–12.4)
Plt Count: 451 10*3/uL — ABNORMAL HIGH (ref 140–370)
RBC: 3.24 10*6/uL — ABNORMAL LOW (ref 4.60–6.10)
RDW: 15.7 % — ABNORMAL HIGH (ref 12.0–14.0)
WBC: 10.6 10*3/uL — ABNORMAL HIGH (ref 4.0–10.0)

## 2014-04-11 LAB — BASIC METABOLIC PANEL, BLOOD
Anion Gap: 12 mmol/L (ref 7–15)
BUN: 46 mg/dL — ABNORMAL HIGH (ref 6–20)
Bicarbonate: 26 mmol/L (ref 22–29)
Calcium: 8.9 mg/dL (ref 8.5–10.6)
Chloride: 100 mmol/L (ref 98–107)
Creatinine: 2.08 mg/dL — ABNORMAL HIGH (ref 0.67–1.17)
GFR: 34 mL/min
Glucose: 137 mg/dL — ABNORMAL HIGH (ref 70–115)
Potassium: 4.9 mmol/L (ref 3.5–5.1)
Sodium: 138 mmol/L (ref 136–145)

## 2014-04-11 LAB — PROTHROMBIN TIME, BLOOD
INR: 3.1
PT,Patient: 32.6 s — ABNORMAL HIGH (ref 9.7–12.5)

## 2014-04-11 LAB — GLUCOSE (POCT)
Glucose (POCT): 156 mg/dL — ABNORMAL HIGH (ref 70–115)
Glucose (POCT): 241 mg/dL — ABNORMAL HIGH (ref 70–115)
Glucose (POCT): 83 mg/dL (ref 70–115)
Glucose (POCT): 126 mg/dL — ABNORMAL HIGH (ref 70–115)

## 2014-04-11 LAB — MAGNESIUM, BLOOD: Magnesium: 2.2 mg/dL (ref 1.7–2.6)

## 2014-04-11 MED ORDER — CARVEDILOL 25 MG OR TABS
25.0000 mg | ORAL_TABLET | Freq: Two times a day (BID) | ORAL | Status: DC
Start: 2014-04-11 — End: 2014-04-13
  Administered 2014-04-11 – 2014-04-13 (×4): 25 mg via ORAL
  Filled 2014-04-11 (×4): qty 1

## 2014-04-11 MED ORDER — WARFARIN SODIUM 3 MG OR TABS
3.0000 mg | ORAL_TABLET | Freq: Every evening | ORAL | Status: DC
Start: 2014-04-11 — End: 2014-04-13
  Administered 2014-04-11 – 2014-04-12 (×2): 3 mg via ORAL
  Filled 2014-04-11 (×2): qty 1

## 2014-04-11 NOTE — Plan of Care (Signed)
Problem: Tissue Perfusion, Cardiopulmonary - Altered  Goal: Early recognition of deterioration  Outcome: Met  Pt vitals WDL at this time. Pt on telemetry, vitals every 6 hours.    Problem: Alteration in Blood Glucose  Goal: Glucose level within specified parameters  Outcome: Met  Pt on blood glucose checks ACHS. Pt with basal insulin and supplemental insulin as needed.     Problem: Discharge Planning  Goal: Participation in care planning  Outcome: Met  Reviewed plan of care with pt. No discharge orders received. Pt actively participating in goal directed behavior and verbalizes understanding when reviewing plan of care. Encouraging goal setting and meeting each goal on pt's level of understanding. Will continue to encourage active participation and update pt and family with changes to plan of care.      Goal: Verbalizes/demonstrates knowledge of discharge instructions  Outcome: Met  Goal: Verbalize knowledge of prescribed medication management plan  Outcome: Met  Goal: Able to perform ADL- independently or with minimal assist  Outcome: Met    Problem: Pain - Acute  Goal: Communication of presence of pain  Outcome: Met  Pt communicates pain using the verbal pain scale.   Goal: Control of acute pain  Outcome: Met  Pt reports that his pain is at an acceptable level with the use of his prn pain medications.   Goal: Knowledge of pain management methods  Outcome: Met    Problem: Bleeding, Risk of  Goal: Absence of active bleeding  Outcome: Met  Patient has no visual s/s of bleeding at this time. Patient labs, neuro status, and vitals are within expected / normal ranges. Bleeding precautions in place. Will continue to monitor patient for any s/s of bleeding. Will continue plan of care.  Goal: Knowledge of bleeding precautions  Outcome: Met    Problem: Breathing Pattern - Ineffective  Goal: Respiratory rate, rhythm and depth return to baseline  Outcome: Met  Pt improved post PTE surgery. Pt now on room air. Pt denies  shortness of breath.     Problem: Skin Integrity Impaired: Secondary to pressure ulcer(admission or hospital acquired), cellulitis, incontinence, surgical wound/incision, chronic wound or skin disease  Goal: Absence of infection signs and symptoms  Change when soiled, integrity impaired, physican order, or, no longer than 7 days.   Outcome: Met  Goal: Decrease in wound size: 10-15% weekly  Outcome: Met  Goal: Prevent further skin compromise  Outcome: Met

## 2014-04-11 NOTE — Plan of Care (Signed)
Problem: Tissue Perfusion, Cardiopulmonary - Altered  Goal: Early recognition of deterioration  Outcome: Pt free of cardiopulmonary distress  Pt Edwin Sandoval, 48 year old, male, s/p PTE was assessed and is being monitored for altered cardiopulmonary status.  On assessment patient's skin was pink, warm, and dry. Pt denies CP, palpitations, SOB or dyspnea / dyspneic on exertion.   Strong peripheral pulses in all extremities, full sensation, No edema noted, skin with rapid capillary refill. Pt denies dizziness at rest or with ambulation. Verbalizes understanding to notify RN for SOB/CP immediately or numbness/ tingling sensations. Pt had been was in afib at the start of shift, now NSR    Will continue to monitor.      Temperature:  [97.9 F (36.6 C)-98.8 F (37.1 C)] 97.9 F (36.6 C) (11/10 2004)  Blood pressure (BP): (111-150)/(64-89) 130/64 mmHg (11/10 2004)  Heart Rate:  [60-124] 124 (11/10 1837)  Respirations:  [18-20] 18 (11/10 2142)  Pain Score:  [-] 2 (11/10 2142)  O2 Device:  [-] None (Room air) (11/10 2004)  O2 Flow Rate (L/min):  [2 l/min] 2 l/min (11/10 0752)  SpO2:  [94 %-97 %] 97 % (11/10 2004)        Problem: Falls, Risk of  Goal: Keep patient free from falls utilizing universal fall precautions  Outcome: Fall precautions in place - no fall today  Pt Edwin Sandoval, male, 48 year old s/p PTE was educated on fall risks and prevention, pt verbalized understanding, uses call light appropriately for needs and does not get out of bed without staff present,  Pt is oriented X4. Pt is deemed low fall risk according to Fall Risk Assessment.   Pt bed is low, call light and bedside table within reach, anti-skid socks on, 2/4 bed rails up, bed alarm is on and audible for additional safety / patient refuses bed alarm inspite education received about pt's high risk of falls, consequences of falls and fall prevention precautions. Pt verbalizes understanding. Board updated with instruction to call and not  fall. Room is organized and free from clutter. Will do hourly rounding and PRN rounding per pt needs.          Problem: Alteration in Blood Glucose  Goal: Glucose level within specified parameters  Outcome: Pt has no episodes of hypo/hyperglycemia today  Pt Edwin Sandoval, 48 year old, male s/p PTE HS BG = 200 no additional coverage needed. Pt received 35 units of Lantus.  Educated on s/s of hypo/hyper glycemia and to call if symptoms occur. PT verbalized understanding.  Will continue to monitor.      Lab Results   Component Value Date     A1C 8.2 03/29/2014     Recent Labs     04/10/14   1233  04/10/14   1744  04/10/14   2010  04/10/14   2022   GLUCPOCT  131*  114  206*  200*       Problem: Discharge Planning  Goal: Participation in care planning  Outcome: Pt participates in care planning to be ready for discharge  Pt Edwin Sandoval, 47 year old, male s/p PTE is involved with care plan by stating "to be free of afib" as a goal by end of shift.  Discussed with patient treatment/care plan to be ready for d/c such as but not limited to doing his IS,  walking more and taking anti arrhythmic meds.  PT walked 2 laps this shift and was able to reach  2200 ml on his IS and is currently in NSR.   Pt verbalized understanding and agreement to plan.  Will continue to monitor.   Goal: Verbalizes/demonstrates knowledge of discharge instructions  Ongoing.  Please see previous note.   Goal: Verbalize knowledge of prescribed medication management plan  Ongoing.  Please see previous note.   Goal: Able to perform ADL- independently or with minimal assist  Ongoing.  Please see previous note.     Problem: Pain - Acute  Goal: Communication of presence of pain  Outcome: Pain regimen effective this shift  Pt Edwin Sandoval, 48 year old, male s/p PTE was assessed and is being monitored for pain this shift.  Pt is able to communicate presence of pain effectively using verbal scale, understands available PRN medications.  Educated pt to notify RN early to avoid increasing pain levels. Pt c/o incisional pain Pain Score: 2 when coughing, well controlled by PRN medication. Comfort measures and other non-pharm techniques incorporated in addition to medication. Pt educated to call RN for chest pain and notify immediately if for any complaints of pain/discomfort. Will continue to assess pain with hourly rounding/PRN per pt needs.    Pain Score: 2  Goal: Control of acute pain  Ongoing.  Please see previous note.   Goal: Knowledge of pain management methods  Ongoing.  Please see previous note.     Problem: Bleeding, Risk of  Goal: Absence of active bleeding  Outcome: No bleeding s/s today   Pt Edwin Sandoval, 48 year old, male s/p PTE for was assessed and is being monitored for active bleeding.   Pt is on warfarin for prevention of blood clots. No bruising, petechiae, or hematoma noted.  .  Incision/ wound CDI with no s/s bleeding.  Pt verbalized understanding for S/S of bleeding and to notify RN with any changes. Will continue to monitor for bleeding.      Lab Results   Component Value Date     INR 2.4 04/10/2014     PTT 34.1* 04/09/2014         Lab Results   Component Value Date     WBC 9.6 04/09/2014     RBC 3.16 04/09/2014     HGB 8.7 04/09/2014     HCT 27.8 04/09/2014     MCV 88.0 04/09/2014     MCHC 31.3 04/09/2014     RDW 15.9 04/09/2014     PLT 418 04/09/2014     MPV 9.4 04/09/2014         Goal: Knowledge of bleeding precautions  Ongoing.  Please see previous note.     Problem: Breathing Pattern - Ineffective  Goal: Respiratory rate, rhythm and depth return to baseline  Outcome: Absence of respiratory distress this shift  Pt Edwin Sandoval, 48 year old, male s/p PTE was assessed and is being monitored for impaired breatrhing.  On assessment patient lungs sounds clear no SOB or dyspnea with exertion. Pt completed resting exercise today and is no longer needing O2.  Pt is on RA with oxygen saturation of 78%. Respiratory rate  and depth  is WNL, rhythm is regular. Pt educated to use IS while awake. HOB is adjusted to comfortable level. Denies cough. Please see EMR flow sheet for in depth assessment.          Will continue to monitor.    pH/pO2/pCO2/BE:  7.45/82/37/1.3 (11/10 1204)    Blood pressure 130/64, pulse 124, temperature 97.9 F (36.6 C), resp. rate  18, height 5' 11"  (1.803 m), weight 117.8 kg (259 lb 11.2 oz), SpO2 97 %.  Goal: Arterial blood gas values and/or saturation return to baseline  Ongoing.  Please see previous note.     Problem: Skin Integrity- Intact patient high risk for impaired skin integrity Braden scale less than or equal to 18  Goal: Skin remains intact  Outcome: Met  Outcome: Pt has no new skin breakdowns thus far this shift  Pt Edwin Sandoval, 48 year old, male, s/p PTE  was assessed and is being monitored for skin breakdown.  On assessment pt is AOx 4-, MAE, able to move around in bed.  Coccyx absent of redness.  Skin under medical devices assessed as well and was found to be intact and free of skin breakdown.   Educated pt regarding prevention of skin breakdown by changing  position Q 2 hours. Pt verbalizes understanding.  Will remind as needed and continue to monitor.  Goal: Knowledge of skin protection measures  Outcome: Met    Problem: Skin Integrity Impaired: Secondary to pressure ulcer(admission or hospital acquired), cellulitis, incontinence, surgical wound/incision, chronic wound or skin disease  Goal: Absence of infection signs and symptoms  Change when soiled, integrity impaired, physican order, or, no longer than 7 days.   Outcome: Met  Pt's surgical wound is CDI, pt afebrile.  No s/s of infection this shift. Will continue to monitor.   Goal: Decrease in wound size: 10-15% weekly  Ongoing.  Please see previous note.   Goal: Prevent further skin compromise  Ongoing.  Please see previous note.

## 2014-04-11 NOTE — Progress Notes (Signed)
PCCM Fellow Progress Note     LOS: 14 days      ID  48 year old male patient with history DM, HTN, CKD who is in ICU after PTE on 03/29/14. Surgery without complications.     INTERVAL EVENTS   -Converted to sinus at around 9 pm last night.   -Had a bowel movement yesterday  -No problems with breathing.   -Started on PO amiodarone  -Used CPAP last night     I/O: negative 1.5L    OBJECTIVE   Drips        Medications   amiodarone  400 mg Q12H    carvedilol  25 mg BID WC    docusate sodium  250 mg BID    fenofibrate  145 mg Daily    furosemide  20 mg Daily    insulin glargine  35 Units HS    insulin glargine  40 Units QAM    insulin lispro  1-10 Units 4x Daily WC    insulin lispro  25 Units TID WC    levothyroxine  75 mcg QAM AC    omega-3 acid ethyl esters  2 g BID    prazosin  1 mg Q12H    rosuvastatin  20 mg QPM    senna  2 tablet HS    warfarin  3 mg QPM       Vital Signs  Temp  Avg: 98.1 F (36.7 C)  Min: 97.7 F (36.5 C)  Max: 98.4 F (36.9 C)  Pulse  Avg: 85.9  Min: 55  Max: 124  BP  Min: 130/64  Max: 158/70  No Data Recorded  Resp  Avg: 18.4  Min: 18  Max: 20  SpO2  Avg: 97.8 %  Min: 96 %  Max: 100 %        Date 03/30/14 0700 - 03/31/14 0659   Shift 0700-1459 1500-2259 2300-0659 24 Hour Total   I  N  T  A  K  E   I.V. 431.7   431.7    NG/GT 0   0    Shift Total 431.7   431.7   O  U  T  P  U  T   Urine 635   635    Emesis/NG Output 50   50    Chest Tube 90   90    Shift Total 775   775   Weight (kg) 111.2 111.2 111.2 111.2         Lines and tubes  Active PICC Line / CVC Line / PIV Line / Drain / Airway / Intraosseous Line / Epidural Line / ART Line / Line Type / Wound     Name: Placement date: Placement time: Site: Days:    Peripheral IV - 20 G Right Hand 04/01/14  0001  Hand  10    Peripheral IV - 22 G Left Forearm 04/05/14  1744  Forearm  5    Incision -  03/29/14 1500 Sternum Anterior;Mid 03/29/14  1500   12          Physical Exam  Gen: NAD, conversing comfortably.  Eyes: EOMI, PERL,  anicteric  HENT: Atraumatic, normocephalic, clear oropharynx  Neck: No JVD, trachea midline, no lymphadenopathy, supple  CV: RRR with rubs from chest tubes, No M/R/G/S3. Chest tubes in place, cordis removed  Resp: basilar crackles, no intercostal retractions/normal effort; sternotomy wound with Provena in place  Abdo: NTND, soft, no masses, obese.  Ext: Trace  to no edema, no clubbing or cyanosis; no joint abnormalities  Neuro: No focal weakness or sensory deficit, CN grossly intact. Mild numbness of R hand index, middle fingers and thumb  Pysch: Appropriate affect and mood, good insight. AnOx3  Skin: Normal temperature, no rash or nodules    Labs  Recent Labs      04/09/14   0534  04/11/14   0540   NA  139  138   K  4.9  4.9   CL  99  100   BICARB  28  26   BUN  45*  46*   CREAT  1.85*  2.08*   Blanco  9.0  8.9   MG   --   2.2   TP  6.3   --    ALB  3.4*   --    TBILI  0.31   --    AST  15   --    ALT  17   --    ALK  54   --        Recent Labs      04/09/14   0534  04/11/14   0540   WBC  9.6  10.6*   HGB  8.7*  9.1*   HCT  27.8*  28.3*   MCV  88.0  87.3   PLT  418*  451*       Recent Labs      04/09/14   0534  04/10/14   0553  04/11/14   0540   PTT  34.1*   --    --    INR  2.1  2.4  3.1     Imaging  CXR - Lines and tube are in place    ASSESSMENT AND PLAN  Edwin Sandoval is a 48 year old male patient with history DM, HTN, CKD who is in ICU after PTE on 03/29/14. Good HD results after surgery. 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     1-CTEPH s/P PTE on 10/29, with good HD results. Pre OP on Adempas and macitentan,  RHC PAP 53/19/30, CO/CI 6.7/2.7, PCWP 20, PVR 101.  HD post OP after extubation, off pressors and sedation: CVP 12, PAP 32/14/22, PCWP 10, CO/CI 7.3/3, SVR 871, PVR 132  2-A fib Now in sinus rhythm.  3-HFrEF   4-CKD: Baseline creatinine around 2. History of wilms tumor s/p nephrectomy  5-Type II DM.    6-OSA on 15 cm water CPAP at night  7-Hx of hypertension, HLP and  hypothyroidism    Plan   -Decrease warfarin to 3 mg  -Post op studies completed. Doesn't need oxygen, no pericardial effusion, improved ventilation to right lung.   -Continue PO amiodarone  -Change metoprolol to carvedilol given new low EF  -Start lasix given echo findings suggestive of heart failure and volume overload.   -EKG to check QTC  -Ambulation. IS as tolerated.    -Continue lantus 40 and 35 and pre prandial insulin 25 TID.   -Continue synthroid  -Continue anti hyperlipidemia meds   -CPAP at night  -Full code/therapeutic INR      This patient was seen and discussed with Dr.Javen Hinderliter     Madie [facility] PCCM fellow     Pulmonary Vascular Attending Addendum        Patient seen and examined with Dr. Darral Dash.        More ambulatory; feeling OK; no new complaints; good UO  with Lasix this AM        Afebrile, VSS, back in SR with VR 60s; O2 sat 100% RA this AM        Lungs clear with good aeration at bases        Normal S1, S2; no S3        Sternal incision w/o erythema or drainage        No LE edema    BUN 46, Creat 2.08, K+4.9, Na+138  WBC 10.6, Hct 28.3, platelets 451,000  INR 3.1    Post PTE with good pulmonary HD result; CKD; new onset A Fib-flutter: spontaneous cardioversion with initiation of amiodarone        12 lead EKG today        PO amiodarone load to continue        Resume diuretics and coreg        Down adjust coumadin AC given rise in INR and amiodarone use    Talmage Coin. Vibhav Waddill, MD

## 2014-04-11 NOTE — Plan of Care (Signed)
Problem: Tissue Perfusion, Cardiopulmonary - Altered  Goal: Early recognition of deterioration  Outcome: Met  SR on telemetry but occasional on Afib last night when pt goes to the bathroom according to pt and per report. S/p PTE (10/29). MSI is well-approximated with SS, CDI, OTA. VSS. Denied chest pain/sob/dyspnea. No edema noted. Continue to monitor.    Problem: Falls, Risk of  Goal: Keep patient free from falls utilizing universal fall precautions  Outcome: Met  Patient is a low fall risk. No falls/injuries noted. Fall risk measures are in place: non-skid socks, bed at its lowest position, door left open, 2/4 side rails are up. 4Ps are to be addressed during hourly rounding. Refused bed alarm. Instructed to call nurse for assistance when getting OOB. Patient stated understanding. Call light within reach.     Problem: Alteration in Blood Glucose  Goal: Glucose level within specified parameters  Outcome: Met  BG check q ac/hs and prn. Administer insulin per sliding scale or as ordered. Instructed patient to call nurse for any s/s of hypo/hyperglycemia. No s/s of hypo/hyperglycemia noted. Continue to monitor.  Problem: Discharge Planning  Goal: Participation in care planning  Outcome: Not Met  No order for discharge. Pt/family are very involved in patient's medical care and are receptive to learning. Update pt/family as needed.    Problem: Pain - Acute  Goal: Communication of presence of pain  Outcome: Met  Pt denied any pain. Instructed patient to report pain when present and is made aware of current pain relief options. Pt stated understanding. Call light within reach. 4Ps are to be addressed during hourly rounding.     Problem: Bleeding, Risk of  Goal: Absence of active bleeding  Outcome: Met  H/H is stable. No active bleeding noted. Pt denied bloody urine/stools and/or any bleeding from any other body orifice. Pt denied dizziness. VSS. Fall measures are in place.    Problem: Breathing Pattern -  Ineffective  Goal: Respiratory rate, rhythm and depth return to baseline  Outcome: Met  S/p PTE POD (03/29/14). Respiration even, regular, and unlabored. SpO2=100% RA. Denied SOB/dyspnea. Instructed to call nurse immediately if symptoms mentioned or any other discomfort is present. Pt stated understanding. Call light within reach.     Problem: Skin Integrity- Intact patient high risk for impaired skin integrity Braden scale less than or equal to 18  Goal: Skin remains intact  Outcome: Met  MSI is well-approximated with SS, CDI, OTA. No pressure-related sore noted. Patient is able to turn self from side to side. Educated patient the importance of turning at least every 2 hours in order to prevent pressure ulcer and patient stated understanding.

## 2014-04-12 LAB — GLUCOSE (POCT)
Glucose (POCT): 140 mg/dL — ABNORMAL HIGH (ref 70–115)
Glucose (POCT): 194 mg/dL — ABNORMAL HIGH (ref 70–115)
Glucose (POCT): 151 mg/dL — ABNORMAL HIGH (ref 70–115)
Glucose (POCT): 184 mg/dL — ABNORMAL HIGH (ref 70–115)

## 2014-04-12 LAB — BASIC METABOLIC PANEL, BLOOD
Anion Gap: 18 mmol/L — ABNORMAL HIGH (ref 7–15)
BUN: 50 mg/dL — ABNORMAL HIGH (ref 6–20)
Bicarbonate: 24 mmol/L (ref 22–29)
Calcium: 9.2 mg/dL (ref 8.5–10.6)
Chloride: 96 mmol/L — ABNORMAL LOW (ref 98–107)
Creatinine: 2.07 mg/dL — ABNORMAL HIGH (ref 0.67–1.17)
GFR: 34 mL/min
Glucose: 187 mg/dL — ABNORMAL HIGH (ref 70–115)
Potassium: 4.8 mmol/L (ref 3.5–5.1)
Sodium: 138 mmol/L (ref 136–145)

## 2014-04-12 LAB — PROTHROMBIN TIME, BLOOD
INR: 3.1
PT,Patient: 32.7 s — ABNORMAL HIGH (ref 9.7–12.5)

## 2014-04-12 MED ORDER — FUROSEMIDE 20 MG OR TABS
20.00 mg | ORAL_TABLET | Freq: Every day | ORAL | Status: AC
Start: 2014-04-12 — End: ?

## 2014-04-12 MED ORDER — INSULIN SYRINGES (DISPOSABLE) U-100 0.5 ML MISC
1.00 | Freq: Four times a day (QID) | Status: AC
Start: 2014-04-12 — End: ?

## 2014-04-12 MED ORDER — OXYCODONE HCL 5 MG OR TABS
5.00 mg | ORAL_TABLET | ORAL | Status: AC | PRN
Start: 2014-04-12 — End: ?

## 2014-04-12 MED ORDER — AMIODARONE HCL 200 MG OR TABS
400.0000 mg | ORAL_TABLET | Freq: Every day | ORAL | Status: DC
Start: 2014-04-13 — End: 2014-04-13
  Administered 2014-04-13: 400 mg via ORAL
  Filled 2014-04-12: qty 2

## 2014-04-12 MED ORDER — INSULIN REGULAR HUMAN 500 UNIT/ML (CONC) SC SOLN
Freq: Three times a day (TID) | SUBCUTANEOUS | Status: AC
Start: 2014-04-12 — End: ?

## 2014-04-12 MED ORDER — WARFARIN SODIUM 3 MG OR TABS
3.00 mg | ORAL_TABLET | Freq: Every evening | ORAL | Status: AC
Start: 2014-04-12 — End: ?

## 2014-04-12 MED ORDER — AMIODARONE HCL 200 MG OR TABS
400.0000 mg | ORAL_TABLET | Freq: Every day | ORAL | Status: DC
Start: 2014-04-13 — End: 2014-04-13

## 2014-04-12 NOTE — Progress Notes (Signed)
Diabetes education note  For inpatient and outpatient recommendations see progress note by Dr. Varney DailyJuang Endocrine Attending    Education:    1. Self-monitor blood glucose:to monitor BGs ACHS    2. Healthy Eating:aim at distributing carbs across meals and keep consistent amounts at ea meal    3. Hypoglycemia:reviewed s/sx and treatment by rule of 15 and reporting glucose < 70 mg/dL R6+x2+ in a week to PCP for reevaluation of DM treatment regimen.    5. Medication:reinforced d/c insulin regimen; pt to restart U-500 insulin 10 unit markings on U-100 syringe before bkft, lunch, and dinner. Thao NP aware and to order 3/10cc 31 G syringes for pt.    Thank you for this interesting consult, will cont to follow    Kathlen BrunswickAnna Greig Altergott.MS,RD,RN,CDE  Diabetes Nurse Educator

## 2014-04-12 NOTE — Progress Notes (Signed)
Diabetes/Endocrine Progress Note    Overnight: no acute events. BGs running within target    Subjective: Pt sitting up in chair; accompanied by wife and mother    Objective:  Temperature:  [97.6 F (36.4 C)-98.5 F (36.9 C)] 97.9 F (36.6 C) (11/12 0746)  Blood pressure (BP): (116-149)/(51-78) 141/68 mmHg (11/12 0746)  Heart Rate:  [53-62] 55 (11/12 0827)  Respirations:  [16-18] 18 (11/12 0746)  Pain Score:  [-] 0 (11/12 0746)  O2 Device:  [-] None (Room air) (11/12 0746)  SpO2:  [96 %-100 %] 97 % (11/12 0746)    GEN: NAD  Resp: unlabored    Diet: carb limited    Blood Sugars reviewed:                            AM      Lu        Di         HS       O/N    11/08  234 197 181 215  Lantus  40   35  Lispro  22 22+4 22+4 4    11/09  154 176 92 106  Lantus  40   35  Lispro  22+3 25+4 25    11/10  133 131 114 200/206  Lantus  40  Lispro  25 25 25     11/11  156 241 83 126  Lantus  40  Lispro  25+3 25+6 25     11/12  140  Lantus  40  Lispro  25      A/P: Ezekiel SlocumbJohn Michael Doe is a 48 year old male 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 BGs within target. Pt prefers to be d/c on U-500 insulin as he was on it as an outpt. He wishes to discuss current inpt U-100 insulin regimen with his endocrinologist and decide then. Per pt's wife, should be going home tomorrow.    Inpatient Recs:   -continue Lispro 25 units ac qac meals (RN to give 0, 1/2, whole dose based on % tray consumed)  -continue Lantus 40/35 units BID  -continue lispro high dose correction scale achs    Outpatient Recs:  - Resume U-500 insulin regimen - 10 unit markings on U-100 syringe TID ac meals (# 1 box of BD U-100 syringes 31G 3/10 cc)  - Follow-up with endocrinologist within 1-2 weeks of d/c     Please page [phone] with questions

## 2014-04-12 NOTE — Discharge Instructions (Addendum)
Discharge packet including list of discharge medications, activity restrictions, wound care given to patient by Jilda Pandahao Devlin Brink, NP.  Check INR on Monday, 04/16/2014.  Follow up with referring physician within 2 weeks.    Discontinue Amiodarone after 1 week OR as directed by your cardiologist.

## 2014-04-12 NOTE — Progress Notes (Addendum)
Daily Progress Note:  04/12/2014     Current Hospital Stay:   15 days - Admitted on: 03/28/2014    Subjective:  POD#14 s/p PTE    Ambulating out in the hallways. No complaints    Objective:  tmax 98.5. Sinus 50s. o2 sat 97%RA    Vital Signs:  Temperature:  [97.6 F (36.4 C)-98.5 F (36.9 C)] 97.9 F (36.6 C) (11/12 0746)  Blood pressure (BP): (116-149)/(51-78) 141/68 mmHg (11/12 0746)  Heart Rate:  [53-62] 53 (11/12 0746)  Respirations:  [16-18] 18 (11/12 0746)  Pain Score:  [-] 0 (11/12 0746)  O2 Device:  [-] None (Room air) (11/12 0746)  SpO2:  [96 %-100 %] 97 % (11/12 0746)    Wt Readings from Last 1 Encounters:   04/10/14 117.8 kg (259 lb 11.2 oz)       Intake/Output (Current Shift):    Intake/Output Summary (Last 24 hours) at 04/12/14 1103  Last data filed at 04/12/14 0926   Gross per 24 hour   Intake   1880 ml   Output   3450 ml   Net  -1570 ml     Current Facility-Administered Medications   Medication    acetaminophen (TYLENOL) solution 650 mg    amiodarone (PACERONE) tablet 400 mg    carvedilol (COREG) tablet 25 mg    dextrose 50 % solution 12.5 g    docusate sodium (COLACE) capsule 250 mg    fenofibrate (TRICOR) tablet 145 mg    furosemide (LASIX) tablet 20 mg    glucagon (GLUCAGON) injection 1 mg    glucose chewable tablet 16 g    glucose oral gel 1 Tube    insulin glargine (LANTUS) injection 35 Units    insulin glargine (LANTUS) injection 40 Units    insulin lispro (HUMALOG) injection 1-10 Units    insulin lispro (HUMALOG) injection 25 Units    levothyroxine (SYNTHROID) tablet 75 mcg    morphine injection 2 mg    nalOXone (NARCAN) injection 0.1 mg    omega-3 acid ethyl esters (LOVAZA) capsule 2 g    ondansetron (ZOFRAN) 8 mg in sodium chloride 0.9 % 50 mL IVPB    oxyCODONE (ROXICODONE) tablet 10 mg    oxyCODONE (ROXICODONE) tablet 5 mg    polyethylene glycol (MIRALAX) packet 17 g    prazosin (MINIPRESS) capsule 1 mg    ramelteon (ROZEREM) tablet 8 mg    rosuvastatin (CRESTOR)  tablet 20 mg    senna (SENOKOT) tablet 17.2 mg    warfarin (COUMADIN) tablet 3 mg     Laboratory data:   Lab Results   Component Value Date    NA 138 04/11/2014    K 4.9 04/11/2014    CL 100 04/11/2014    BICARB 26 04/11/2014    BUN 46* 04/11/2014    CREAT 2.08* 04/11/2014    GLU 137* 04/11/2014    [place] 8.9 04/11/2014     Lab Results   Component Value Date    WBC 10.6* 04/11/2014    HGB 9.1* 04/11/2014    HCT 28.3* 04/11/2014    PLT 451* 04/11/2014     No results found for: AST, ALT, ALK, TBILI, DBILI, TP, ALB  Lab Results   Component Value Date    INR 3.1 04/12/2014     VQ scan 04/09/14  04/09/2014 9:58 PM - Electronic Interface To Epic, Ancillary System      Narrative     EXAM DESCRIPTION  LUNG VENTILATION+PERFUSION  CLINICAL HISTORY:  Pulmonary hypertension. Evaluate for the presence and extent of pulmonary   thromboembolic disease.    TECHNIQUE:  The patient was ventilated with 20.4 mCi of Xenon-133 and scans were performed  during washin, equilibrium, and washout phases of ventilation.  The patient was then injected with 4.2 mCi of Tc-81mMAA and the lungs were   scanned in multiple projections.    COMPARISON:  Chest radiograph 03/20/2014. VQ 03/20/2014.    FINDINGS  Decreased perfusion and ventilation is present in the left lung, particularly   in the left lower lobe. Subsegmental perfusion defects are present in the   anterior segment of the right upper lobe and in the right middle lobe.    No abnormal retained activity is present in the lungs on delayed wash-out   ventilation images.    IMPRESSION:  1. Compared with lung perfusion scan 03/20/1014, there is markedly improved   perfusion in the right lower lobe status post PTE.    2. Matched, decreased perfusion and ventilation is present in the left lung,   particularly in the left lower lobe.       Echocardiogram 04/10/14  04/10/2014 2:21 PM - Electronic Interface To Epic, Ancillary System      Component Results     Component Value Ref Range & Units Status  Lab    LV Ejection Fraction 44 (L) >50 % Final [facility]    LA Volume Index 37 (H) 16 - 28 mL/m2 Final [facility]    Cardac Output 5.1 4.0 - 8.0 L/min Final [facility]    Cardiac Index 2.2 (L) 2.8 - 4.2 L/min/m2 Final [facility]    PA Pressure 20 20 - 30 mm Hg + CVP Final [facility]    Report   Final [facility]    [facility] Accession #: [id number] Requesting Physician: PGeorgiann Mohs([phone]  Reason for Study: S/P PTE  2D/MMode Measurements   -------------------------------------------------------------------------  LVdia: 6.0cm (4.2-5.9) IVS: 1.3cm (0.6-1.0) LA: 4.2cm (3.0-4.0)  LVsys: 3.9cm (2.3-3.8) LVPW: 1.2cm (0.6-1.0) IVC: 2.4cm (1.2-2.3)  Aorta: 3.5cm (2.1-3.5) LVOT: 2.2cm (1.7-2.3) C.O.: 5.1L/min (4-8)   -------------------------------------------------------------------------    Findings:  Technically good complete 2-D, spectral and color Doppler study.   The LV is mildly dilated with overall mildly depressed systolic function  (433%EF by 2D-Biplane method). No regional wall motion abnormalities are  identified. Unable to evaluate LV diastolic function. The E deceleration  time is 171 ms. Mitral annular motion velocity is 14 cm/sec. There is  mild concentric hypertrophy of the LV. Dyssynergic motion of the septum  is noted, of uncertain significance (Post/Op Septum). Right ventricle is  mildly enlarged with overall depressed systolic function. There is  hypertrophy of the right ventricle.   The left and right atria are mildly enlarged. The left atrium volume  index is 37 mls/M2. The inferior vena cava is dilated. During  inspiration IVC collapse is <50%. No pericardial effusion is noted. The  pulmonary artery is of normal size.   The aortic valve appears thickened, but valve excursion is normal. The  mitral valve is somewhat thickened, but appears to open normally. There  is mild mitral regurgitation. There is trace pulmonic regurgitation.   There is mild tricuspid regurgitation. Peak velocity of the TR envelope  is 2.25 M/sec  suggesting normal peak PA and RV systolic pressures at 20 +  CVP mm Hg. The pulmonic and tricuspid valves appear normal.     Conclusions:  1) LV is mildly dilated with mildly  depressed systolic function.  2) Right ventricle is mildly enlarged with depressed systolic function.  3) Normal pulmonary arterial pressure.  4) Mild concentric LV hypertrophy.  5) Mitral valve thickened with mild regurgitation.  6) Mild tricuspid regurgitation.  7) Compared to previous study on 03/30/2014, no significant change.         Assessment and Plan:  POD#14 s/p PTE    -anticoagulation with Coumadin. INR today 3.1  -DM2: on insulins  -hypothyroidism: on synthroid  -stage 3 CKD: Cr 9.60  -chronic systolic HF: AV40%. On Coreg. Being diuresed with lasix  -AF: currently sinus 96s. on Amio 438m BID  -postop studies complete  -d/c home soon    AMalachy Moan MG[place]burg PA-C  Cardiothoracic Surgery  Pager [phone]    Addendum: POD14.  Studies complete.  INR therapeutic.

## 2014-04-12 NOTE — Plan of Care (Signed)
Problem: Tissue Perfusion, Cardiopulmonary - Altered  Goal: Early recognition of deterioration  Outcome: Not Met  Assess patients cardiac and pulmonary status q 4 hrs and prn.  Pt alert and oriented, SB/SR on tele, with short episodes of bigeminal PAC's. HR 50s-60s. No CP/discomfort or palpitations.  No peripheral edema noted. VSS: BP 138/58 mmHg   Pulse 64   Temp(Src) 98.2 F (36.8 C)   Resp 20   Ht _0  (1.803 m)   Wt 117.8 kg (259 lb 11.2 oz)   BMI 36.24 kg/m2   SpO2 98%,  02 sat on ra.  No respiratory discomfort noted, pt denies sob.  Labs monitored as ordered, electrolytes replaced as needed.    Lab Results   Component Value Date     NA 138 04/12/2014     K 4.8 04/12/2014     CL 96 04/12/2014     BICARB 24 04/12/2014     BUN 50 04/12/2014     CREAT 2.07 04/12/2014     GLU 187 04/12/2014     [place] 9.2 04/12/2014     Amio po dose decreased today, plan for dc tomorrow per MD.    Problem: Alteration in Blood Glucose  Goal: Glucose level within specified parameters  Outcome: Met  BG monitoring in place ac/hs.  Insulin admin per ss and nutritional coverage.  Pt actively involved in monitoring of Bg and dm management. Will continue to monitor.    Problem: Discharge Planning  Goal: Participation in care planning  Outcome: Met  Pt awake and alert and participating in plan of care and dc planning.  Family at bedside. Plan for dc tomorrow.    Problem: Pain - Acute  Goal: Communication of presence of pain  Outcome: Met  Pain assessed q 4 hrs and prn.  Pt denies pain this am.  Aware to notify RN of any changes.     Problem: Bleeding, Risk of  Goal: Absence of active bleeding  Outcome: Met  No s/s of active bleeding noted. Pt on coumadin with INR 3.1.  VSS. No drainage noted to incisions.  Will continue to monitor.    Problem: Breathing Pattern - Ineffective  Goal: Respiratory rate, rhythm and depth return to baseline  Outcome: Met  Breathing pattern assessed. Pt on ra with 02 sat 98%.  Denies sob.      Problem: Skin  Integrity Impaired: Secondary to pressure ulcer(admission or hospital acquired), cellulitis, incontinence, surgical wound/incision, chronic wound or skin disease  Goal: Absence of infection signs and symptoms  Change when soiled, integrity impaired, physican order, or, no longer than 7 days.   Outcome: Met  Skin assessed and braden scale completed.  Surgical incision with steristrips, no s/s of infection noted. Will continue to monitor.

## 2014-04-12 NOTE — Progress Notes (Addendum)
PTE Progress note  Current Hospital  LOS: 15 days     Subjective: Feels well.  Walking multiple times around unit without SOB.  IS to 2250.  Normal  BMs.  Incisional pain well controlled.      Tele:  NSB-SR 50-70s, occasional 1-2 second pauses    Vitals:    Latest Entry  Range (last 24 hours)    Temperature: 97.9 F (36.6 C)  Temp  Avg: 98 F (36.7 C)  Min: 97.6 F (36.4 C)  Max: 98.5 F (36.9 C)    Blood pressure (BP): 141/68 mmHg  BP  Min: 116/51  Max: 149/73    Heart Rate: 53  Pulse  Avg: 57.7  Min: 53  Max: 62    Respirations: 18  Resp  Avg: 17.7  Min: 16  Max: 18    SpO2: 97 %  SpO2  Avg: 97.7 %  Min: 96 %  Max: 100 %     Weight - scale: 117.8 kg (259 lb 11.2 oz)  Percentage Weight Change (%): -0.46 %       Intake/Output Summary (Last 24 hours) at 04/12/14 0926  Last data filed at 04/12/14 0926   Gross per 24 hour   Intake   2360 ml   Output   3450 ml   Net  -1090 ml     Exam:   Gen: NAD, comfortable in bed  HEENT: EOMI, PERRLA, OP without lesions  NECK: Supple, no cervical LAD  CV: RRR, no MRCG  RESP: CTAB, no wheezes/rales  ABD: soft, NT/ND, NABS  EXT: No LE edema  NEURO: non-focal, grossly intact  SKIN:  Sternal incision clean and dry, no erythema    Labs:   CBC  Recent Labs      04/11/14   0540   WBC  10.6*   HGB  9.1*   HCT  28.3*   PLT  451*      Chemistry  Recent Labs      04/11/14   0540   NA  138   K  4.9   CL  100   BICARB  26   BUN  46*   CREAT  2.08*   GLU  137*     8.9   MG  2.2     No results for input(s): ALK, AST, ALT, TBILI, DBILI, ALB in the last 72 hours.     Coags  Recent Labs      04/11/14   0540  04/12/14   0539   PT  32.6*  32.7*   INR  3.1  3.1       Medications:  Scheduled Meds   amiodarone  400 mg Q12H    carvedilol  25 mg BID WC    docusate sodium  250 mg BID    fenofibrate  145 mg Daily    furosemide  20 mg Daily    insulin glargine  35 Units HS    insulin glargine  40 Units QAM    insulin lispro  1-10 Units 4x Daily WC    insulin lispro  25 Units TID WC     levothyroxine  75 mcg QAM AC    omega-3 acid ethyl esters  2 g BID    prazosin  1 mg Q12H    rosuvastatin  20 mg QPM    senna  2 tablet HS    warfarin  3 mg QPM     PRN Meds   acetaminophen  650  mg Q4H PRN    dextrose  12.5 g PRN    glucagon  1 mg Once PRN    glucose  4 tablet PRN    glucose  1 Tube PRN    morphine  2 mg Q4H PRN    nalOXone  0.1 mg Q2 Min PRN    ondansetron (ZOFRAN) IVPB  8 mg Q8H PRN    oxyCODONE  10 mg Q4H PRN    oxyCODONE  5 mg Q4H PRN    polyethylene glycol  17 g Daily PRN    ramelteon  8 mg Nightly PRN            ASSESSMENT/PLAN: [name] is a 48 year old male with PMH of childhood Wilms tumor, s/p nephrectomy, DM-II, hypothyroidism, HTN, OSA, CRI (baseline Cr 2.47) who underwent PTE 03/28/14.    1. S/p PTE, POD # 12 with good hemodynamic results (final hemodynamic result: CVP 12, PA 32/14 (22), CO 7.3 (3.0), PVR 132.   2. Paroxsymal atrial fibrillation, converted to NSR yesterday with occ sinus pauses, decrease amiodarone po 400 mg daily.  3. DM II, HgA1C 8.2. Appreciate endocrinology assistance. Cont lantus insulin 40 units AM, 35 units PM, per endocrinology, increase Lispro insulin 25 units with meals, cont SSI.   4. CRI, Cr improved 2.0 (baseline 2.47).  Recheck chemistry today.    5. HTN, cont coreg, given LV systolic dysfunction, and for blood pressure control.   6. Hypothyroidism, cont synthroid  7. Dyslipidemia, cont crestor.     Jettie Booze, NP  Pulmonary and critical care  P:  [phone]    Pulmonary Vascular Attending Addendum       Patient seen and examined with [name].       Doing well, no new complaints; sinus brady; no further A fib       Afebrile; BP 606T-016W systolic; HR 10X; O2 sat 32% RA       Lungs clear with good aeration at bases       Normal S1, S2 w/o rub       No LE edema       Sternal incision w/o erythema or drainage    INR 3.1  Chem pending (2.7 liters urine yesterday)    Post PTE with good pulmonary HD result; CKD; new onset A Fib-flutter:  spontaneous cardioversion with initiation of amiodarone          INR stable at desired range between 2.5-3.5          With brady, to decrease amiodarone dosing to 400 mg daily          Anticipate discharge tomorrow    Talmage Coin. Sudiksha Victor, MD

## 2014-04-13 LAB — BASIC METABOLIC PANEL, BLOOD
Anion Gap: 17 mmol/L — ABNORMAL HIGH (ref 7–15)
BUN: 46 mg/dL — ABNORMAL HIGH (ref 6–20)
Bicarbonate: 24 mmol/L (ref 22–29)
Calcium: 9.4 mg/dL (ref 8.5–10.6)
Chloride: 100 mmol/L (ref 98–107)
Creatinine: 1.91 mg/dL — ABNORMAL HIGH (ref 0.67–1.17)
GFR: 38 mL/min
Glucose: 129 mg/dL — ABNORMAL HIGH (ref 70–115)
Potassium: 4.6 mmol/L (ref 3.5–5.1)
Sodium: 141 mmol/L (ref 136–145)

## 2014-04-13 LAB — GLUCOSE (POCT): Glucose (POCT): 119 mg/dL — ABNORMAL HIGH (ref 70–115)

## 2014-04-13 LAB — PROTHROMBIN TIME, BLOOD
INR: 3.1
PT,Patient: 33.1 s — ABNORMAL HIGH (ref 9.7–12.5)

## 2014-04-13 MED ORDER — AMIODARONE HCL 200 MG OR TABS
200.00 mg | ORAL_TABLET | Freq: Two times a day (BID) | ORAL | Status: AC
Start: 2014-04-13 — End: ?

## 2014-04-13 NOTE — Progress Notes (Signed)
Diabetes/Endocrine Progress Note    Overnight: no acute events. BGs running within target    Subjective: Pt sitting up in chair waiting to be d/c; accompanied by wife     Objective:  Temperature:  [98.2 F (36.8 C)-98.6 F (37 C)] 98.6 F (37 C) (11/13 0800)  Blood pressure (BP): (115-134)/(58-69) 134/69 mmHg (11/13 0800)  Heart Rate:  [60-71] 60 (11/13 0800)  Respirations:  [18-20] 20 (11/13 0800)  Pain Score:  [-] 0 (11/13 0800)  O2 Device:  [-] None (Room air) (11/13 0800)  SpO2:  [98 %-99 %] 98 % (11/13 0800)    GEN: NAD  Resp: unlabored    Diet: carb limited    Blood Sugars reviewed:                            AM      Lu        Di         HS       O/N    11/08  234 197 181 215  Lantus  40   35  Lispro  22 22+4 22+4 4    11/09  154 176 92 106  Lantus  40   35  Lispro  22+3 25+4 25    11/10  133 131 114 200/206  Lantus  40  Lispro  25 25 25     11/11  156 241 83 126  Lantus  40  Lispro  25+3 25+6 25     11/12  140 187 157 184  Lantus  40  Lispro  25 25+4 25+3    11/13  119  Lantus  40  Lispro  25      A/P: Edwin Sandoval is a 48 year old male 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 BGs within target. Pt being d/c today; reiterated insulin home regimen.    Inpatient Recs:   -continue Lispro 25 units ac qac meals (RN to give 0, 1/2, whole dose based on % tray consumed)  -continue Lantus 40/35 units BID  -continue lispro high dose correction scale achs    Outpatient Recs:  - Resume U-500 insulin regimen - 10 unit markings on U-100 syringe TID ac meals (# 1 box of BD U-100 syringes 31G 3/10 cc). Take only 5 units for lunch after discharge given lantus 40 units given this morning.  - Follow-up with endocrinologist within 1-2 weeks of d/c     Please page [phone] with questions

## 2014-04-13 NOTE — Plan of Care (Signed)
Problem: Breathing Pattern - Ineffective   Goal: Respiratory rate, rhythm and depth return to baseline   Outcome: Met   Pt is is on RA sat 99-100% using cpap at night.  Denies shortness of breath, no acute respiratory distress noted at this time. Patient aware to notify RN for any distress. Continue to monitor.     Problem: Discharge Planning   Goal: Participation in care planning   Outcome: Met   Pt to be discharge today, post op studies complete.  Continue with plan of care.     Problem: Pain - Acute   Goal: Communication of presence of pain   Outcome: Met   Patient denies pain, see flow sheet for details on assessment. Patient aware to notify RN for pain assessment and intervention as needed. Verbalizes understanding of importance of pain control in recovery and not to allow pain to reach intolerable level. Continue to monitor.    Problem: Tissue Perfusion - Cardiopulmonary, Altered   Goal: Hemodynamic stability   Outcome: Met   VS stable as noted, afebrile, denies chest pain or dizziness. Patient aware to notify RN for any chest discomfort or dizziness. Continue to monitor.     Problem: Falls - Risk of   Goal: Absence of falls   Outcome: Met   Pt on fall protocol, non skid footwear at all time, bed alarm on, bed in lower position, and in lock position  Patient remains free from fall/injury at this time. Call light within reach, uses appropriately for assistance as needed. Hourly rounding maintained for safety. Continue to monitor.     Problem: Skin Integrity Impaired: Secondary to pressure ulcer(admission or hospital acquired), cellulitis, incontinence, surgical wound/incision, chronic wound or skin disease   Goal: Absence of infection signs and symptoms   Outcome: Met   No s/s of skin infection or new skin breakdown noted at this time. Incision open to air with steri strips, chest tube sites open to air, assessed as noted. Patient verbalizes and demonstrates understanding importance of repositioning self every  2 hours while resting in bed and every 15 minutes while sitting in chair to maintain skin integrity. Continue to monitor.     Problem: Bleeding - Risk of   Goal: Absence of active bleeding   Outcome: Met   INR 3.1  VSS, afebrile. Will continue to monitor for acute changes in patient status. Will continue to monitor for bleeding, CBCs.     Problem: Alteration in Blood Glucose   Goal: Glucose level within specified parameters   Outcome: Met   Assess and monitor blood sugar and administer sliding scale insulin as ordered. Educate patient on signs and symptoms of hypoglycemia/hyperglycemia. Blood glucose was monitored. Patient verbalized understanding of blood glucose monitoring. Will continue to monitor and to update as needed.

## 2014-04-13 NOTE — Discharge Summary (Signed)
Patient Name:Dvid Donella StadeMichael Nevel    Dictating Practitioner:  Jilda Pandahao Drcar, NP    Staff Physician:  Teressa SenterWilliam Airis Barbee, MD    Date of Admission:  03/28/2014  Date of Discharge:  04/13/2014    Discharge Diagnoses:   1.  Chronic thromboembolic pulmonary hypertension, s/p bilateral pulmonary thromboendarterectomy  2.  Paroxysmal atrial fibrillation/flutter following PTE  3.  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  Diabetes mellitus-II  5.  Essential hypertension  6.  Chronic renal insufficiency  7.  Obstructive sleep apnea  8.  Hypothyroidism  9.  Morbid obesity     Procedures Performed:   1.  Bilateral pulmonary thromboendarterectomy    Hospital Course:  The patient was admitted on 03/28/2014 for planned surgery.  Please refer to Clayborne ArtistJohn Michael Maxim's history and physical and preoperative work up leading to the patient's hospital admission.  The patient underwent bilateral pulmonary endarterectomy surgery on 03/29/2014 by Dr. Haze JustinMichael Madani.  Surgical specimens removed were [place] Classification Level III on the right and [place] Classification level IV on the left.  Total circulatory arrest time was 41 minutes: 21 minutes for the right lung  and 20 minutes for the left lung.    Early hemodynamic measurement upon arrival to the Intensive Care Unit on dopamine 5 mcg/kg/min and phenylephrine 40 mcg/min were: HR 90, BP 113/66, CVP 22, PA pressures 42/26 (mean 32), cardiac output 8.5 L/min (cardiac index 3.5).  The patient had great urine output and minimal chest tube output within the initial hours of arriving to the ICU.  Early anticoagulation was initiated with heparin prophylaxis to prevent the risk of rethrombosis to the pulmonary arteries.  Oxygenation initially marginal, though rapidly improved with an increase in PEEP; no evident reperfusion injury by CXR.     On postoperative day  1, the patient was successfully extubated following spontaneous breathing trial.   Off all inotropic support, the patient's  final hemodynamic measurement prior to Swan-Ganz removal were:  HR 79, BP 167/64, CVP  12, PA 32/14 (mean 22), cardiac output via thermodilution 7.3 L/min with a cardiac index of 3.0 l/min/m2.   Without significant brady-dysrhythmia, patient's external pacer wires were removed without issue on postoperative day 1 without issue. Endocrinology consultation was obtained to assist with managing patient's diabetes and insulin requirement. His baseline HgA1C was 8.2, with outpatient history of high insulin supplementation.       Coumadin was added to the patient's anticoagulation regimen on postoperative day 2.  The following day, patient experienced a brief episode of asymptomatic atrial fibrillation with controlled ventricular rates.  His heparin prophylaxis was changed to heparin infusion.  Outpatient beta-blocker (carvedolol) was resumed.  On postoperative day 4, patient was stable to transfer out of the ICU to telemetry.  He continued to have a lot of chest tube drainage, preventing removal of his chest tubes.  By post operative day 10, with decreased chest tube output, patient's chest tubes were removed without complication.  Patient had more incidence of atrial fibrillation with controlled ventricular rates.  His beta-blockade was changed to Lopressor for better rhythm control, however, he continued to have recurrence of atrial fibrillation/flutter.  Oral amiodarone was added to his regimen on postoperative day 12.  Though he was not fully loaded on Amiodarone, he had short, self limiting pauses (approximately 2 second duration). His medication regimen proved to be difficult given his need for anti-hypertensives for elevated blood pressures (ACE-I/ARB avoided due to his elevated baseline renal insufficiency), and  the beta blockade effect on his heart rate, but eventually tolerated resumption of his carvedilol.     The rest of the patient's hospitalization consisted of aggressive physical therapy, pulmonary  physiotherapy, coumadin titration, and a series of post-operative studies leading to discharge.  The patient was hemodynamically stable and ready for hospital discharge on postoperative day number 15.  He continued to have episodes of atrial fibrillation/flutter at controlled ventricular rates.        PHYSICAL EXAMINATION PRIOR TO DISCHARGE:  GENERAL:  The patient is in good spirits, in no apparent distress, anxious for discharge.  VITAL SIGNS: Blood pressure 134/69, pulse 60, temperature 98.6 F (37 C), resp. rate 20, height 5\' 11"  (1.803 m), weight 117.8 kg (259 lb 11.2 oz), SpO2 98 %.  CHEST:  Sternal incision well healing, without erythema, drainage or tenderness.   NECK: Absent jugular venous distention.   HEART: Normal S1,S2, absent rubs, murmurs.    LUNGS:  Lung sounds clear to auscultation bilaterally without wheezes or crackles.  EXTREMITIES:  Absent lower extremity edema.  Pulses at radial and pedal are intact.  SKIN:  Old catheter sites at groin and neck without hematoma or tenderness.      POST PTE STUDY RESULTS:    1.  Chest xray (04/09/2014):  Lungs moderately well expanded, left basal atelectasis, no pneumothorax.        2.  Ventilation/Perfusion (04/09/2014):  Increased perfusion throughout right lung; decreased perfusion left lower lobe with persistent defect anterior left lower lobe; poor ventilation left lung/LLL (medial), ? Technical issue.      3.  Echocardiogram (04/10/2014): Mild dilated LV with depressed systolic function, EF 44%, postop septal motion abnormality; mild RAE,  RVE; no pericardial effusion; TR 2.25 (20+cvp), est CO 5.1      4.  Rest and Exercise Oximetry (DATE 04/10/2014):  RA ABG: 7.45, pCO2 37. PO2 8,  98% to 98% ambulating at a speed of 1.5 mph    5.  Laboratory Data prior to discharge:  INR 3.1 (04/13/14), WBC 10.6, hemoglobin 9.1, hematocrit 28.3, platelet count 451,000 (04/11/14), sodium 141, potassium 4.6, BUN 24, creatinine 1.9 (04/13/14).      Discharge Medications:  1.  Warfarin 3 mg, daily or as directed for INR goal 2.5-3.5.   2. Amiodarone 200 mg bid for ~ 1 week  3. Coreg 25 mg bid  4. Oxycodone 5 mg q 4-6 hours prn pain  5. Levothyroxine 75 mcg daily  6. Crestor 20 mg daily   7. Trilepex 135 mg daily  8. Insulin U-500 10 unit markings with meals tid  9. Prazosin 1 mg bid  10. Lasix 20 mg daily    Supplemental oxygen: not indicated    Discontinue home medications: Macitentan, adempas  Check INR in 3 days  F/u with cardiologist in 1 week  Follow up with referring physician within 2-3 weeks  F/u with Endocrinology in 2-3 weeks.     In summary, the patient is a 48 year old male with chronic thromboembolic pulmonary hypertension who underwent successful pulmonary thromboendarterectomy by Dr. Blanche EastMadani on 03/29/2014.  The patient had an excellent hemodynamic result and had an unremarkable postoperative course, with the exception of developing paroxysmal atrial fibrillation.  This was moderately controlled with Amiodarone.  He was instructed to continue on Amiodarone for approximately one more week's time or as directed by his cardiologist.  The patient was given a discharge packet that outlined instructions on wound care and activity restrictions.  The patient is to  avoid heavy lifting over 5-10 pounds, soak wound in a bathtub or jacuzzi, driving, and returning back to work for at least 6-8 weeks.  The patient should report any signs and symptoms of infection or for acute decompensation, he is to seek medical attention urgently.      The importance of lifelong anticoagulation was emphasized to patient with goal INR to be between 2.5 to 3.5 while on warfarin therapy.  We ask that an echocardiogram and ventilation-perfusion scan be obtained in 6 months and 1 year to serve as his new baseline.  For any questions regarding care with our PTE program at [facility] [facility], please contact us at [phone].      Jettie Booze, NP

## 2014-04-13 NOTE — Progress Notes (Addendum)
PTE Progress note  Current Hospital  LOS: 16 days     Subjective: Feels well.  Anxious to be discharged.  Walking multiple times around problems, denies SOB, lightheadedness.      Tele:  Converted to atrial flutter/afib at 1900 with controlled vent rates 70s, converted back to NSR at 6:30 am this morning; 1 isolated pause of 2.34 sec.      Vitals:    Latest Entry  Range (last 24 hours)    Temperature: 98.6 °F (37 °C)  Temp  Avg: 98.4 °F (36.9 °C)  Min: 98.2 °F (36.8 °C)  Max: 98.7 °F (37.1 °C)    Blood pressure (BP): 134/69 mmHg  BP  Min: 115/63  Max: 170/67    Heart Rate: 60  Pulse  Avg: 61.9  Min: 49  Max: 74    Respirations: 20  Resp  Avg: 19.2  Min: 18  Max: 20    SpO2: 98 %  SpO2  Avg: 98.5 %  Min: 98 %  Max: 100 %     Weight - scale: 117.8 kg (259 lb 11.2 oz)  Percentage Weight Change (%): -0.46 %       Intake/Output Summary (Last 24 hours) at 04/13/14 1013  Last data filed at 04/13/14 0414   Gross per 24 hour   Intake    480 ml   Output    600 ml   Net   -120 ml     Exam:   Gen: NAD, comfortable in bed  HEENT: EOMI, PERRLA, OP without lesions  NECK: Supple, no cervical LAD  CV: RRR, no MRCG  RESP: CTAB, no wheezes/rales  ABD: soft, NT/ND, NABS  EXT: No LE edema  NEURO: non-focal, grossly intact  SKIN:  Sternal incision clean and dry, no erythema    Labs:   CBC  Recent Labs      04/11/14   0540   WBC  10.6*   HGB  9.1*   HCT  28.3*   PLT  451*      Chemistry  Recent Labs      04/11/14   0540  04/12/14   1225  04/13/14   0555   NA  138  138  141   K  4.9  4.8  4.6   CL  100  96*  100   BICARB  26  24  24   BUN  46*  50*  46*   CREAT  2.08*  2.07*  1.91*   GLU  137*  187*  129*   CA  8.9  9.2  9.4   MG  2.2   --    --      No results for input(s): ALK, AST, ALT, TBILI, DBILI, ALB in the last 72 hours.     Coags  Recent Labs      04/12/14   0539  04/13/14   0555   PT  32.7*  33.1*   INR  3.1  3.1       Medications:  Scheduled Meds  • amiodarone  400 mg Daily   • carvedilol  25 mg BID WC   • docusate sodium  250  mg BID   • fenofibrate  145 mg Daily   • furosemide  20 mg Daily   • insulin glargine  35 Units HS   • insulin glargine  40 Units QAM   • insulin lispro  1-10 Units 4x Daily WC   • insulin lispro    25 Units TID WC    levothyroxine  75 mcg QAM AC    omega-3 acid ethyl esters  2 g BID    prazosin  1 mg Q12H    rosuvastatin  20 mg QPM    senna  2 tablet HS    warfarin  3 mg QPM     PRN Meds   acetaminophen  650 mg Q4H PRN    dextrose  12.5 g PRN    glucagon  1 mg Once PRN    glucose  4 tablet PRN    glucose  1 Tube PRN    morphine  2 mg Q4H PRN    nalOXone  0.1 mg Q2 Min PRN    ondansetron (ZOFRAN) IVPB  8 mg Q8H PRN    oxyCODONE  10 mg Q4H PRN    oxyCODONE  5 mg Q4H PRN    polyethylene glycol  17 g Daily PRN    ramelteon  8 mg Nightly PRN            ASSESSMENT/PLAN: Edwin Sandoval is a 48 year old male with PMH of childhood Wilms tumor, s/p nephrectomy, DM-II, hypothyroidism, HTN, OSA, CRI (baseline Cr 2.47) who underwent PTE 03/28/14.    1. S/p PTE, POD # 15 with good hemodynamic results (final hemodynamic result: CVP 12, PA 32/14 (22), CO 7.3 (3.0), PVR 132.   2. Paroxsymal atrial fibrillation, still with episodes of controlled atrial fib/flutter.  Cont Amiodarone   3. DM II, HgA1C 8.2. Appreciate endocrinology assistance. Per Endocrinology recommendation, to resume Insulin U-500 (pt preference) 10 unit markings tid with meals.  F/u with endocrinology within 2 weeks.    4. CRI, Cr improved 1.91, (baseline 2.47).   5. HTN, cont coreg, given (new) LV systolic dysfunction, and for blood pressure control.   6. Hypothyroidism, cont synthroid  7. Dyslipidemia, cont crestor.   8.  Stable for home discharge.      Discharge medications:  1.  Warfarin 3 mg, daily  or as directed for INR goal 2.5-3.5.   2.  Amiodarone 200 mg bid for ~ 1 week  3.  Coreg 25 mg bid  4.  Oxycodone 5 mg q 4-6 hours prn pain  5.  Levothyroxine 75 mcg daily  6.  Crestor 20 mg daily   7.  Trilepex 135 mg daily  8.  Insulin U-500 10 unit  markings with meals tid  9.  Prazosin 1 mg bid  10.  Lasix 20 mg daily  Supplemental oxygen not indicated    Discontinue home medications:  Macitentan, adempas  Check INR in 3 days  F/u with cardiologist in 1 week  Follow up with referring physician within 2-3 weeks  F/u with Endocrinology in 2-3 weeks.      Edwin Booze, NP  Pulmonary and critical care  P:  [phone]    Pulmonary Vascular Attending Addendum       Patient seen and examined with Edwin Sandoval.       Doing well, back in SR; no complaints; ready for discharge       Afebrile, VSS with HR 60s/70s sinus, O2 sat 98% RA       Lungs clear       Edwin Sandoval with normal S1, S2; S3       Sternal incision w/o drainage, erythema       No LE edema    INR 3.1  BUN 46, Creat 1.91, K+4.6. Na+141    Post PTE with good  pulmonary HD result; CKD; paroxysmal A Fib-flutter        OK for discharge on meds above; hesitant to change amiodarone dosing at this stage, and will discuss with his cardiologist re: length of Rx         Follow up INR early next week with INR target 2.5-3.5         Discharge instructions provided    Edwin Coin. Aalaysia Liggins, MD

## 2014-04-13 NOTE — Plan of Care (Signed)
Pt dc instructions given by NP Drcar, and reviewed by Rn. Pt and wife both deny any questions, deny any concerns, deny any distress. Pt and wife to fly home to [place] today. Belongings packed by wife. Room scanned for left belongings. Pt given meds from our pharmacy and daily schedule of administration reviewed with patient and wife. Pt ambulatory and asked to ambulate to vehicle. Wife and patient dc'd.

## 2014-04-14 DIAGNOSIS — R001 Bradycardia, unspecified: Secondary | ICD-10-CM

## 2014-04-14 DIAGNOSIS — I498 Other specified cardiac arrhythmias: Secondary | ICD-10-CM

## 2014-04-14 LAB — ECG 12-LEAD
ATRIAL RATE: 59 {beats}/min
P AXIS: 58 degrees
PR INTERVAL: 154 ms
QRS INTERVAL/DURATION: 142 ms
QT: 512 ms
QTC INTERVAL: 506 ms
R AXIS: -66 degrees
T AXIS: 10 degrees
VENTRICULAR RATE: 59 {beats}/min

## 2014-05-04 DIAGNOSIS — I272 Other secondary pulmonary hypertension: Secondary | ICD-10-CM

## 2014-05-04 DIAGNOSIS — I2699 Other pulmonary embolism without acute cor pulmonale: Secondary | ICD-10-CM

## 2014-05-04 DIAGNOSIS — R001 Bradycardia, unspecified: Secondary | ICD-10-CM

## 2014-05-04 DIAGNOSIS — R9431 Abnormal electrocardiogram [ECG] [EKG]: Secondary | ICD-10-CM

## 2014-05-04 DIAGNOSIS — I498 Other specified cardiac arrhythmias: Secondary | ICD-10-CM

## 2014-05-04 LAB — ECG 12-LEAD
ATRIAL RATE: 55 {beats}/min
P AXIS: 48 degrees
PR INTERVAL: 138 ms
QRS INTERVAL/DURATION: 98 ms
QT: 446 ms
QTC INTERVAL: 426 ms
R AXIS: 27 degrees
T AXIS: 60 degrees
VENTRICULAR RATE: 55 {beats}/min

## 2014-05-08 NOTE — Anesthesia Postprocedure Evaluation (Signed)
Anesthesia Transfer of Care Note    Patient: Edwin Sandoval    Procedures performed: Procedure(s):  PTE PULMONARY THROMBOENDARTERECTOMY    Vital signs: stable           Anesthesia Post Note    Patient: Edwin Sandoval    Procedure(s) Performed: Procedure(s):  PTE PULMONARY THROMBOENDARTERECTOMY      Final anesthesia type: general    Patient location: ICU    Post anesthesia pain: adequate analgesia    Post assessment: no apparent anesthetic complications, tolerated procedure well and no evidence of recall    Mental status: sedated    Airway Patent: Yes    Last Vitals:   Filed Vitals:    04/13/14 0800   BP: 134/69   Pulse: 60   Temp: 37 C   Resp: 20   SpO2: 98%       Post vital signs: stable    Hydration: adequate    N/V:no    Anesthetic complications: no    Disposal of controlled substances: All controlled substances during the case accounted for and disposed of per hospital policy    Plan of care per primary team.

## 2014-06-14 LAB — PREPARE PLATELET PHERESIS

## 2014-06-14 LAB — PREPARE FFP/THAWED PLASMA

## 2019-05-11 ENCOUNTER — Other Ambulatory Visit: Payer: Self-pay

## 2021-03-04 IMAGING — US US SOFT TISSUE NECK/THYROID
1 series · 14 of 25 positions shown · non-contrast
Comparison: 04/10/15

HISTORY: Left thyroid lobectomy 7484.
TECHNIQUE: Multiple images from a real-time, duplex and color Doppler ultrasound  of the thyroid are performed.

[Series 1: us soft tissue neck/thyroid · 48 acquisitions, 14 frames shown]
[im 1/48]
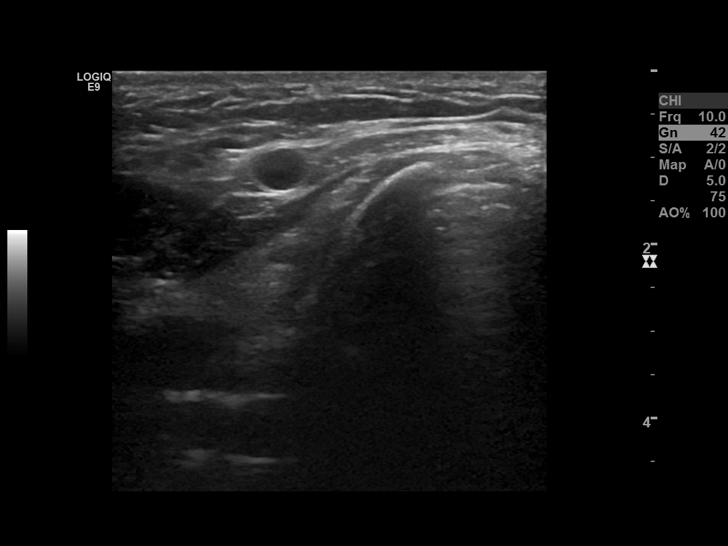
[im 4/48]
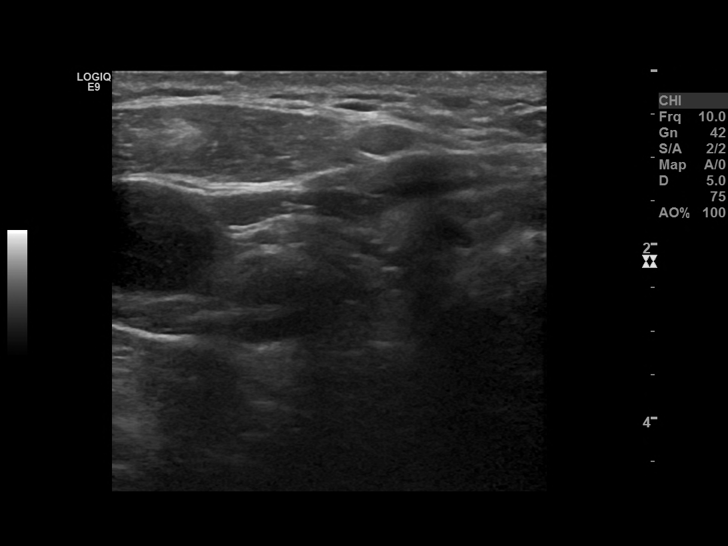
[im 8/48]
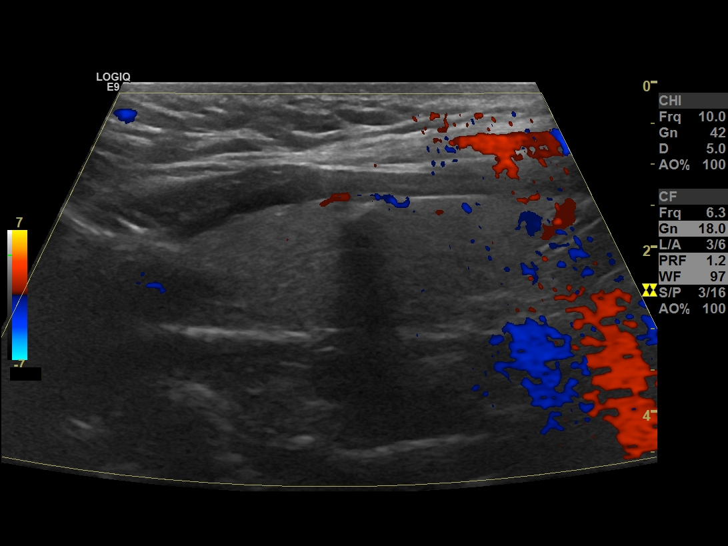
[im 12/48]
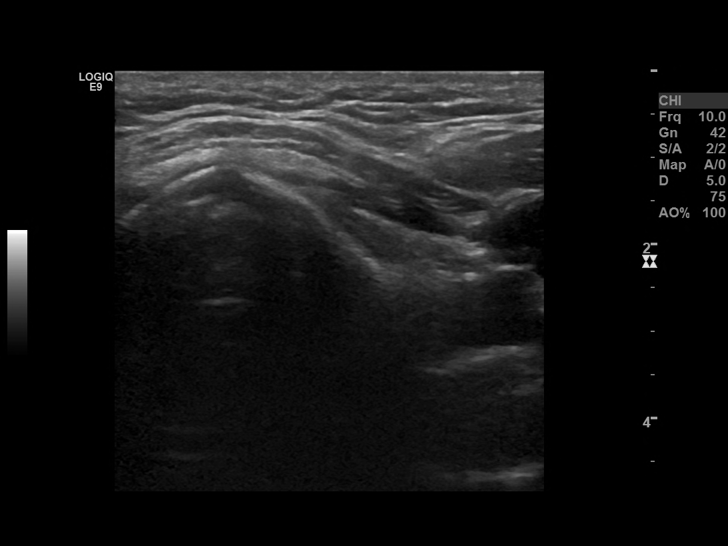
[im 16/48]
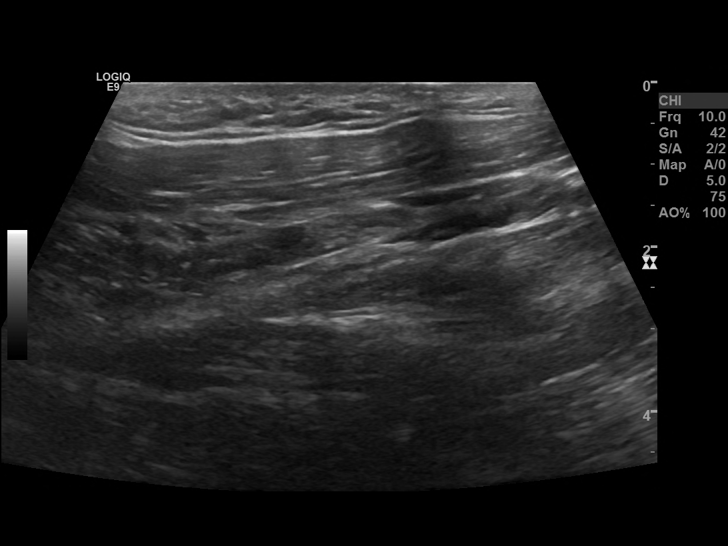
[im 18/48]
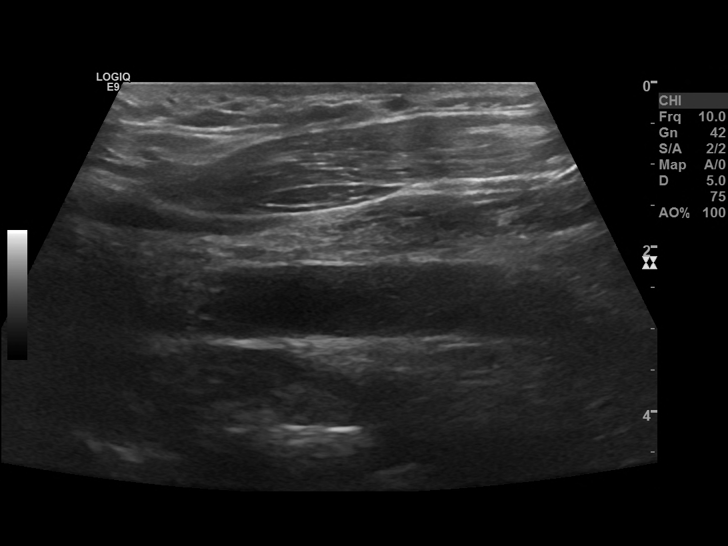
[im 22/48]
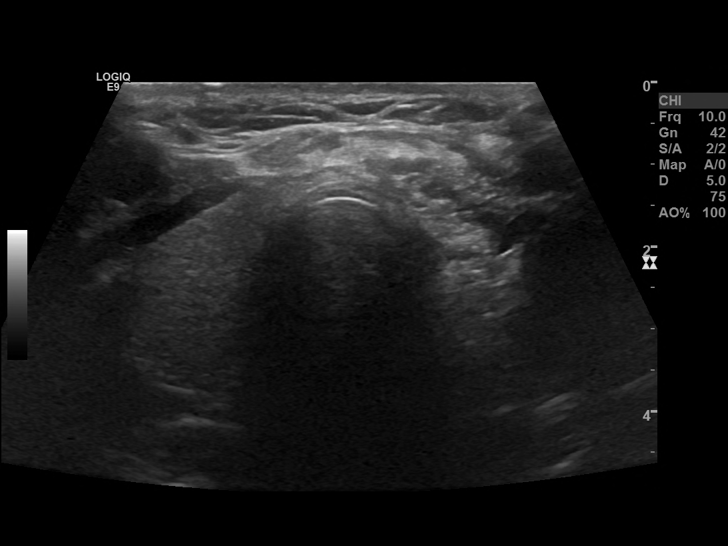
[im 26/48]
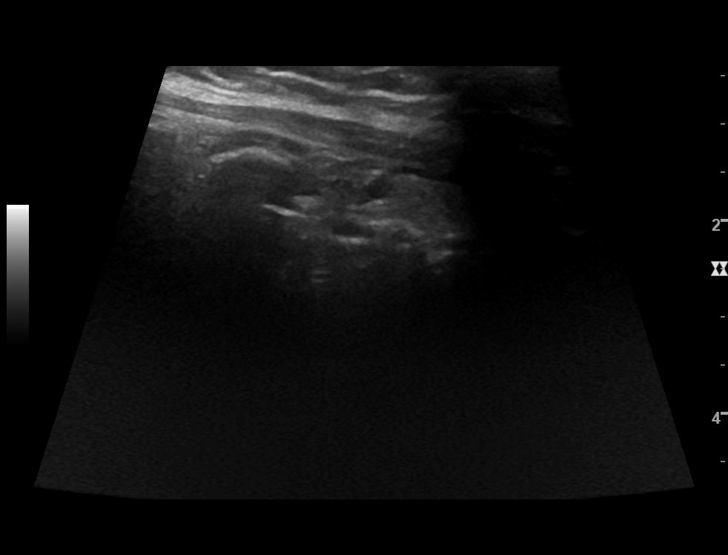
[im 30/48]
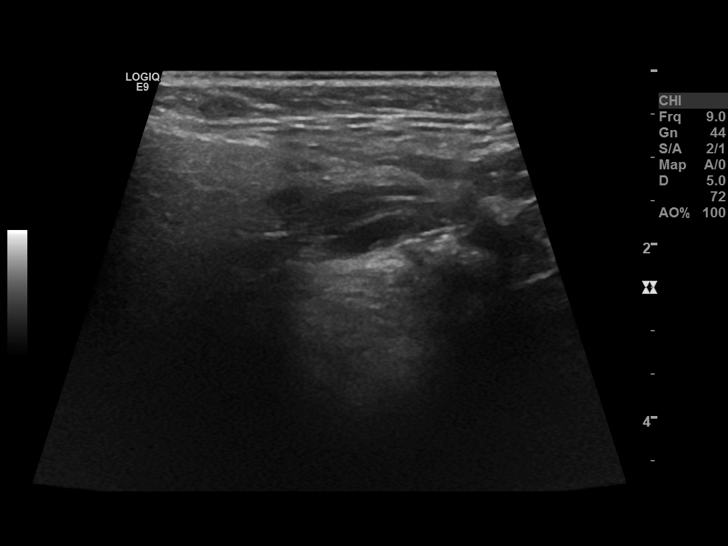
[im 32/48]
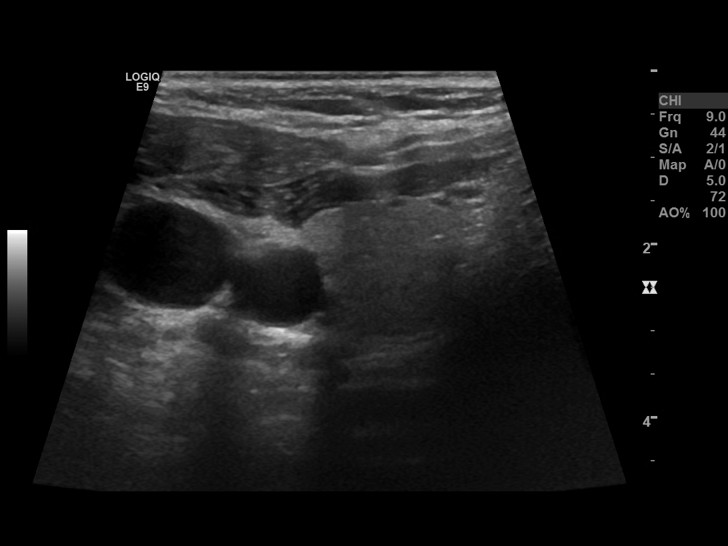
[im 36/48]
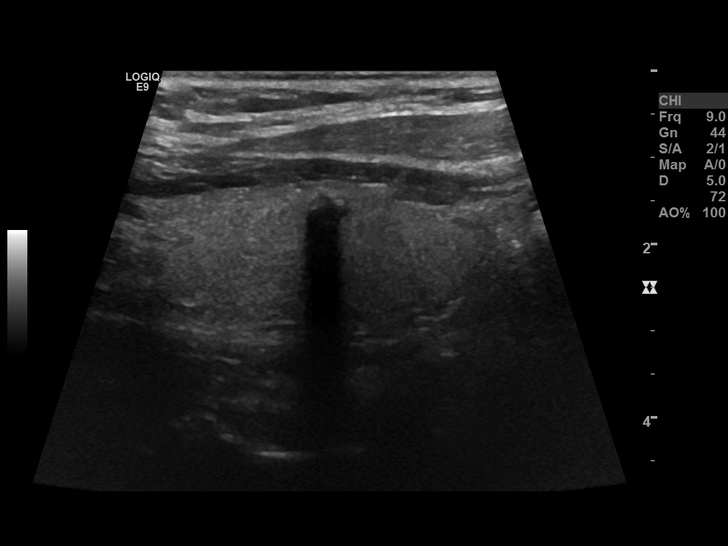
[im 40/48]
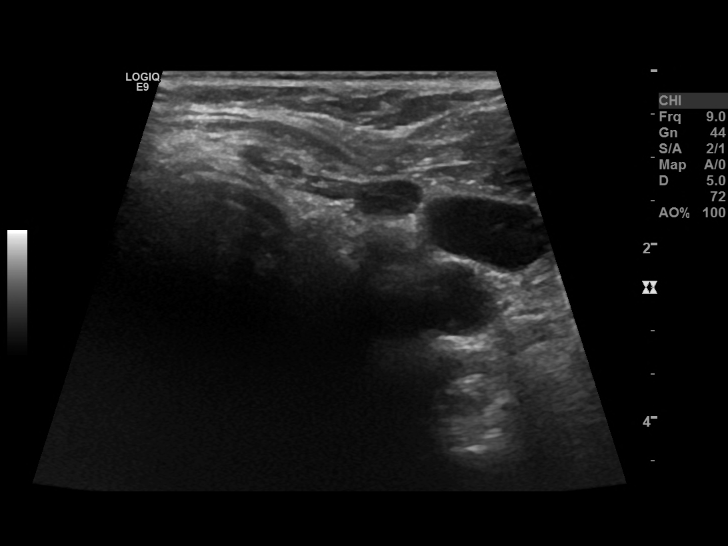
[im 44/48]
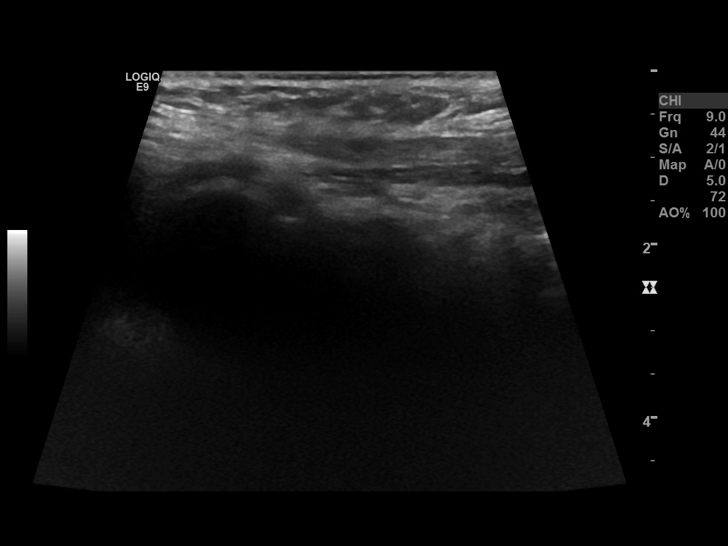
[im 48/48]
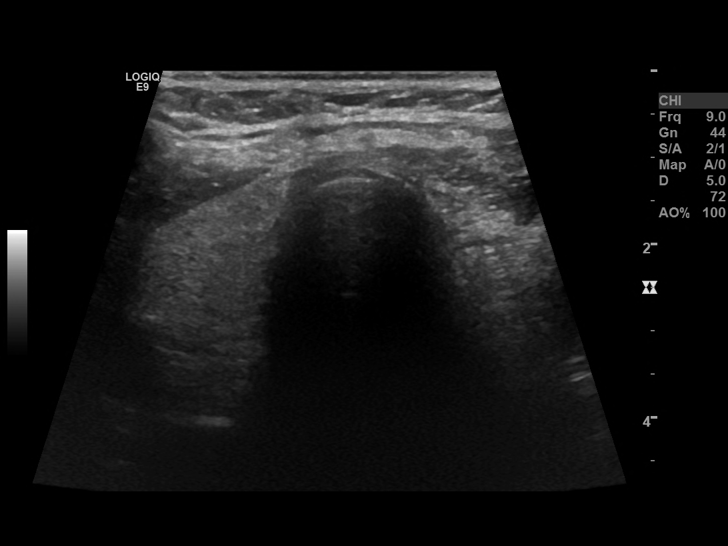

[14 of 25 positions shown; findings below may reference images not displayed]

FINDINGS: GLAND SIZE:

RIGHT LOBE:  50 x 16 x 24 mm  Previous exam:  53 x 19 x 17 mm

LEFT LOBE:  Surgically absent  Previous exam:  43 x 16 x 16 mm

ISTHMUS:  Absent  

THYROID NODULES:

RIGHT LOBE:  None

LEFT LOBE:  Surgically absent

THYROID ISTHMUS:  Surgically absent

MISCELLANEOUS FINDINGS:  No new findings in the right lobe of thyroid. The macrocalcification is not clinically significant.
IMPRESSION: No additional thyroid imaging recommended. No new nodules.

ACR TI-RADS recommendations for nodules:

TR5 (>= 7 points) - FNA if > 1 cm, follow up if 0.5-0.9 cm every year for 5 years

TR4 (4-6 points) - FNA if >=1.5 cm, follow up if 1-1.4 cm in 1, 2, 3, and 5 years

TR3 (3 points) - FNA if 2.5 cm, follow up if 1.5-2.4 cm in 1, 3, and 5 years

TR2 (2 points) & TR1 (0 points) - No FNA or follow up

Reference: Meng, Maty, et al.  ACR Thyroid Imaging, Reporting and Data System (TI-RADS):  White Paper of the ACR TI-RADS Committee. J Am Coll Radiology 7484, 14(5) 587-595

## 2021-07-01 IMAGING — US US SOFT TISSUE NECK/THYROID
1 series · 14 of 25 positions shown · non-contrast
Comparison: March 28, 2020

REASON FOR EXAM: Thyroid cancer

[Series 1: us soft tissue neck/thyroid · 36 acquisitions, 14 frames shown]
[im 1/36]
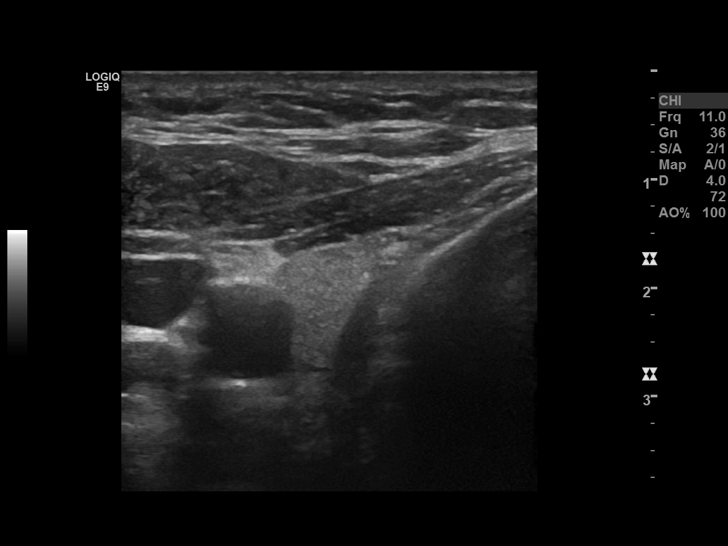
[im 3/36]
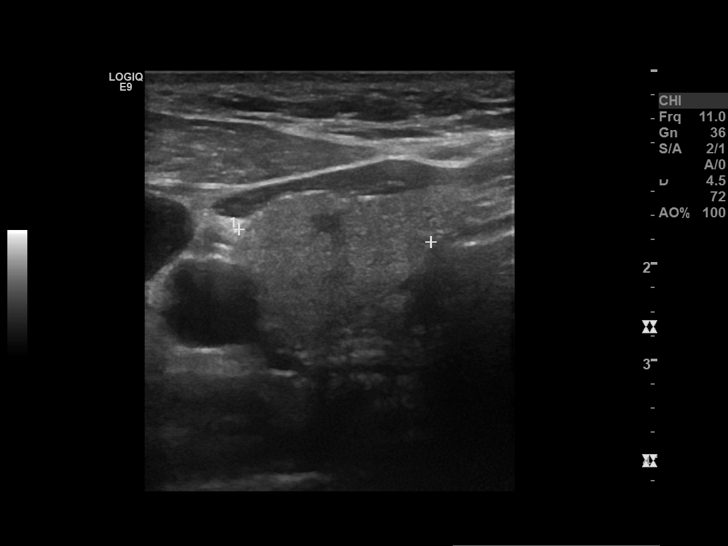
[im 6/36]
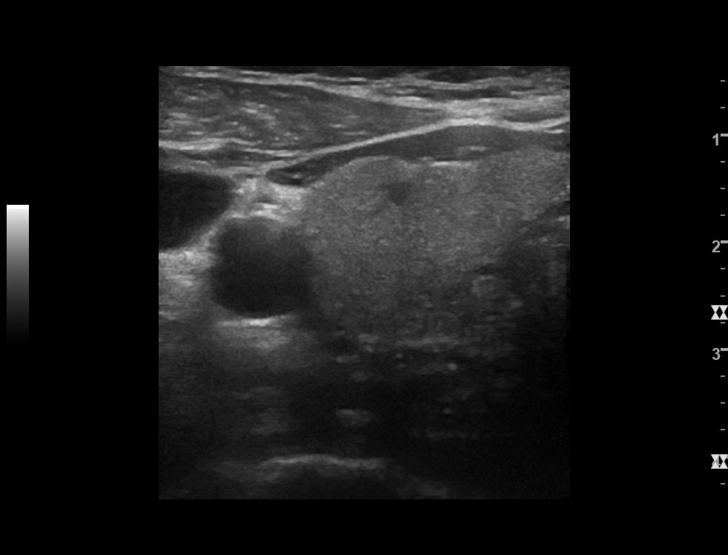
[im 9/36]
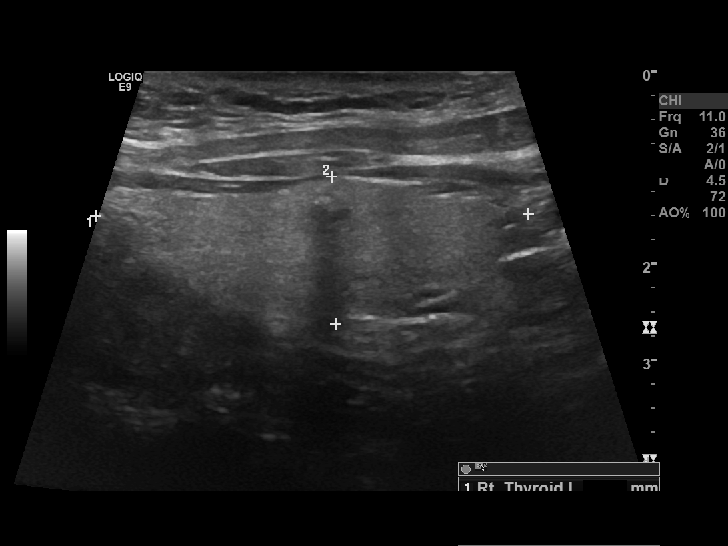
[im 12/36]
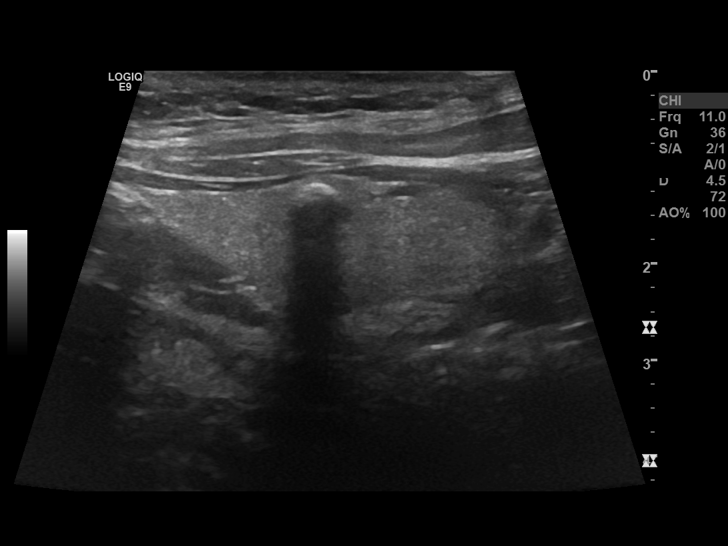
[im 14/36]
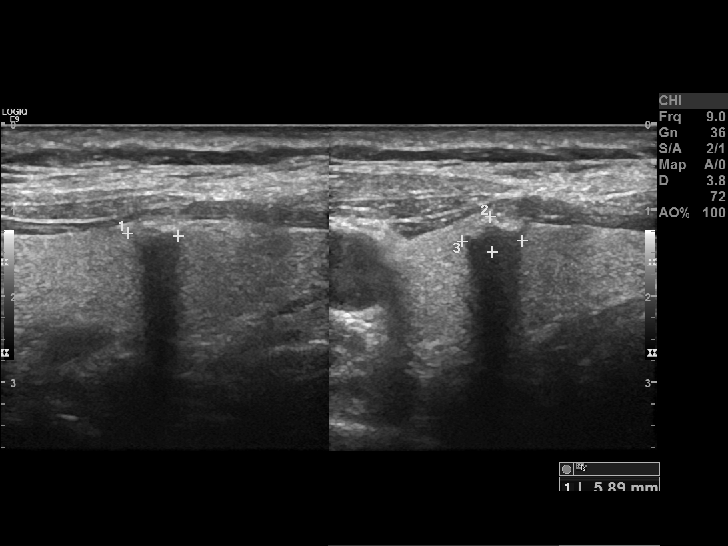
[im 17/36]
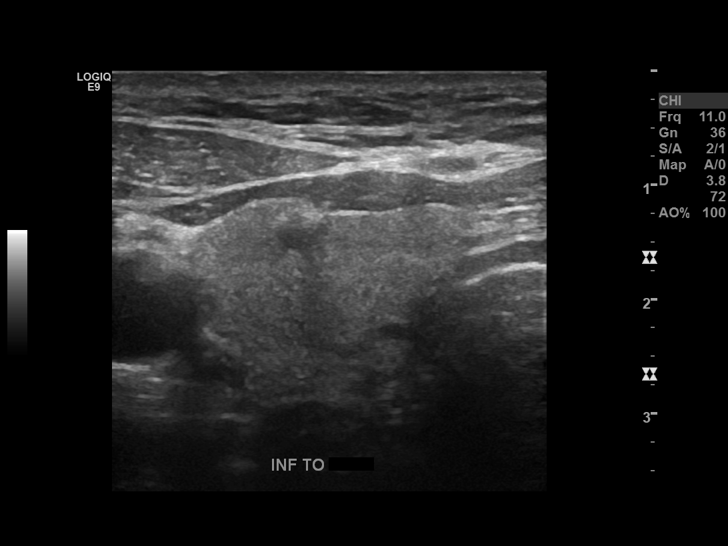
[im 19/36]
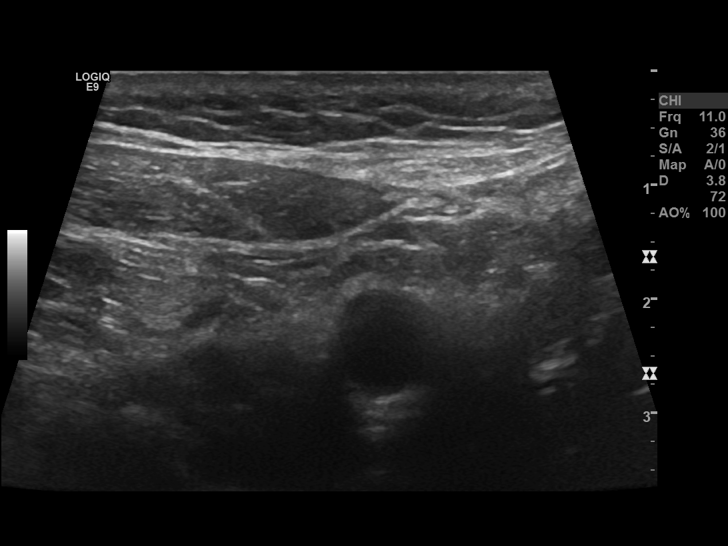
[im 22/36]
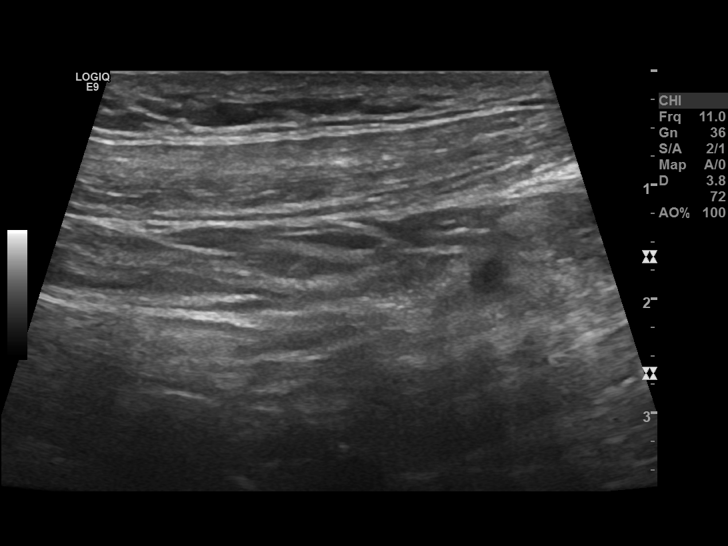
[im 24/36]
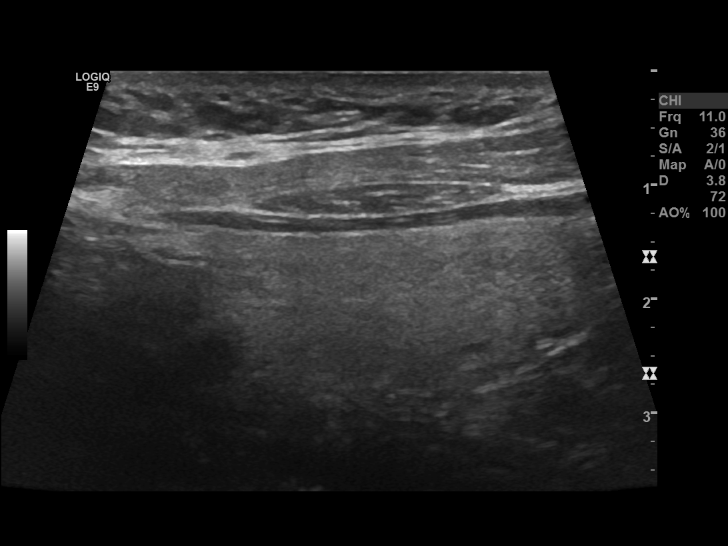
[im 27/36]
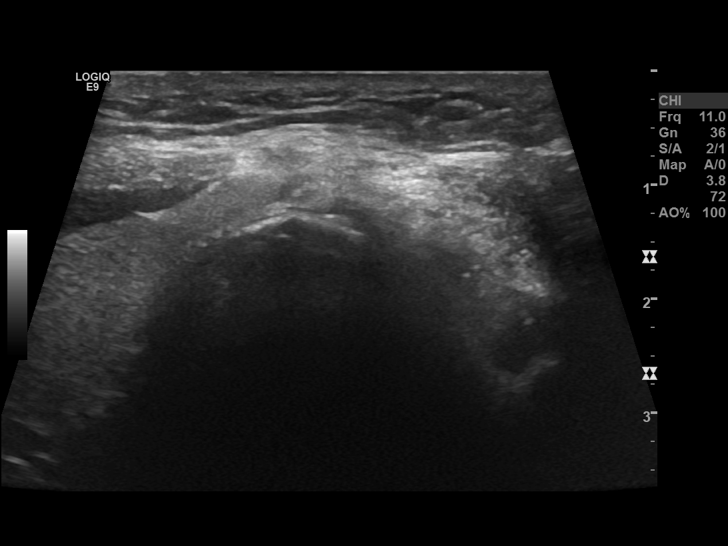
[im 30/36]
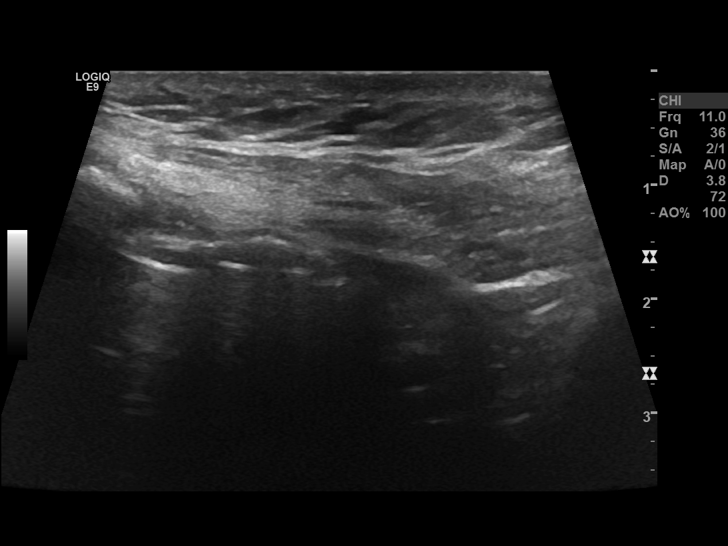
[im 33/36]
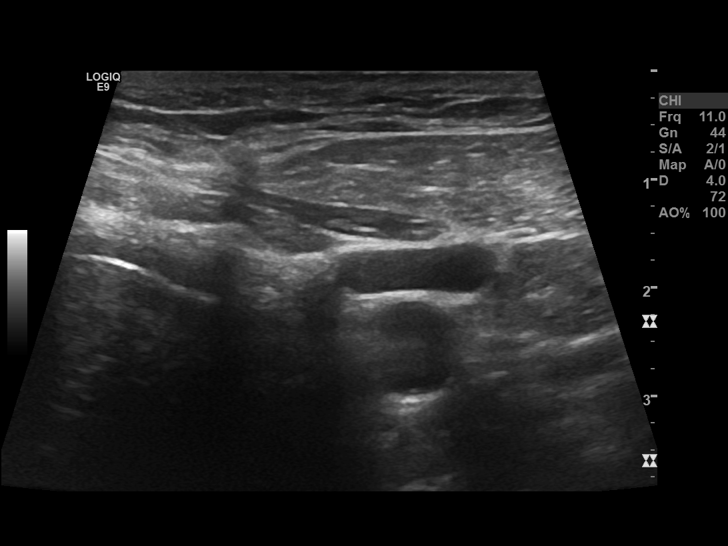
[im 36/36]
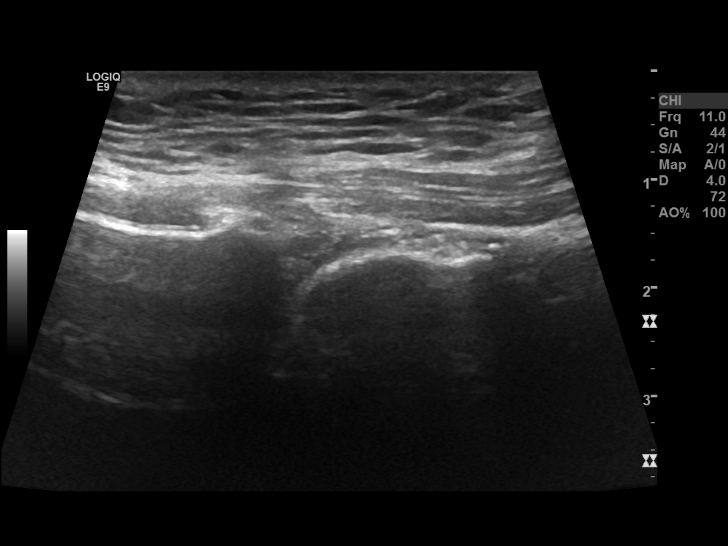

[14 of 25 positions shown; findings below may reference images not displayed]

TECHNIQUE AND FINDINGS:  Multiple ultrasound images of the thyroid gland were obtained. The right lobe of the thyroid measures 4.5 x 1.5 x 1.2 cm and has a normal background echotexture. An area of increased echoes with acoustic shadowing is seen anteriorly measuring approximately 6 x 7 mm.

The thyroid isthmus measures 4 mm thickness.

Left lobe of thyroid is surgically absent. No abnormal soft tissue seen in the surgical bed.
IMPRESSION: Status post left thyroidectomy. No suspicious abnormality identified in the residual right thyroid. Small calcified nodule is present.
